# Patient Record
Sex: Female | Born: 1980 | Race: Asian | Hispanic: No | Marital: Married | Smoking: Never smoker
Health system: Southern US, Community
[De-identification: ages and names within clinical notes are randomized; demographics above are authoritative.]

## PROBLEM LIST (undated history)

## (undated) DIAGNOSIS — N719 Inflammatory disease of uterus, unspecified: Secondary | ICD-10-CM

## (undated) DIAGNOSIS — T8859XA Other complications of anesthesia, initial encounter: Secondary | ICD-10-CM

## (undated) DIAGNOSIS — J45909 Unspecified asthma, uncomplicated: Secondary | ICD-10-CM

## (undated) DIAGNOSIS — G5600 Carpal tunnel syndrome, unspecified upper limb: Secondary | ICD-10-CM

## (undated) DIAGNOSIS — O26899 Other specified pregnancy related conditions, unspecified trimester: Secondary | ICD-10-CM

## (undated) HISTORY — DX: Inflammatory disease of uterus, unspecified: N71.9

## (undated) HISTORY — DX: Unspecified asthma, uncomplicated: J45.909

## (undated) HISTORY — PX: OTHER SURGICAL HISTORY: SHX169

## (undated) HISTORY — DX: Other specified pregnancy related conditions, unspecified trimester: G56.00

## (undated) HISTORY — PX: BILATERAL SALPINGECTOMY: SHX5743

---

## 2014-02-26 HISTORY — PX: OTHER SURGICAL HISTORY: SHX169

## 2017-12-09 DIAGNOSIS — Z8759 Personal history of other complications of pregnancy, childbirth and the puerperium: Secondary | ICD-10-CM | POA: Insufficient documentation

## 2017-12-09 DIAGNOSIS — Z9079 Acquired absence of other genital organ(s): Secondary | ICD-10-CM | POA: Insufficient documentation

## 2018-01-04 DIAGNOSIS — Z8619 Personal history of other infectious and parasitic diseases: Secondary | ICD-10-CM | POA: Insufficient documentation

## 2018-03-22 DIAGNOSIS — Z91018 Allergy to other foods: Secondary | ICD-10-CM | POA: Insufficient documentation

## 2018-03-22 DIAGNOSIS — Z8709 Personal history of other diseases of the respiratory system: Secondary | ICD-10-CM | POA: Insufficient documentation

## 2018-03-22 DIAGNOSIS — Z9109 Other allergy status, other than to drugs and biological substances: Secondary | ICD-10-CM | POA: Insufficient documentation

## 2018-09-26 DIAGNOSIS — M545 Low back pain, unspecified: Secondary | ICD-10-CM | POA: Insufficient documentation

## 2018-09-26 DIAGNOSIS — R29898 Other symptoms and signs involving the musculoskeletal system: Secondary | ICD-10-CM | POA: Insufficient documentation

## 2019-02-07 DIAGNOSIS — N979 Female infertility, unspecified: Secondary | ICD-10-CM | POA: Insufficient documentation

## 2019-03-16 DIAGNOSIS — R87610 Atypical squamous cells of undetermined significance on cytologic smear of cervix (ASC-US): Secondary | ICD-10-CM | POA: Insufficient documentation

## 2019-05-23 DIAGNOSIS — N7011 Chronic salpingitis: Secondary | ICD-10-CM | POA: Insufficient documentation

## 2019-10-12 ENCOUNTER — Other Ambulatory Visit: Payer: Self-pay

## 2019-10-13 ENCOUNTER — Encounter: Payer: Self-pay | Admitting: Family Medicine

## 2019-10-13 ENCOUNTER — Ambulatory Visit: Payer: Managed Care, Other (non HMO) | Admitting: Family Medicine

## 2019-10-13 VITALS — BP 102/80 | HR 82 | Temp 96.9°F | Ht 63.75 in | Wt 163.2 lb

## 2019-10-13 DIAGNOSIS — G8929 Other chronic pain: Secondary | ICD-10-CM | POA: Diagnosis not present

## 2019-10-13 DIAGNOSIS — J4530 Mild persistent asthma, uncomplicated: Secondary | ICD-10-CM

## 2019-10-13 DIAGNOSIS — H66002 Acute suppurative otitis media without spontaneous rupture of ear drum, left ear: Secondary | ICD-10-CM

## 2019-10-13 DIAGNOSIS — M545 Low back pain: Secondary | ICD-10-CM | POA: Diagnosis not present

## 2019-10-13 MED ORDER — FLUTICASONE-SALMETEROL 100-50 MCG/DOSE IN AEPB: 1.0000 | INHALATION_SPRAY | Freq: Two times a day (BID) | RESPIRATORY_TRACT | 3 refills | Status: DC

## 2019-10-13 MED ORDER — AMOXICILLIN 500 MG PO CAPS: 500.0000 mg | ORAL_CAPSULE | Freq: Two times a day (BID) | ORAL | 0 refills | Status: AC

## 2019-10-13 NOTE — Patient Instructions (Signed)
Use flonase 2 sprays each nostril once daily x 1-2 wks Take claritin, zyrtec, or allegra 1 tab daily x 1-2 wks Take antibiotic as directed

## 2019-10-13 NOTE — Progress Notes (Addendum)
Shelby Ellis is a 39 y.o. female  Chief Complaint  Patient presents with  . Establish Care    Moved from [place]. Pt c/o ear pain starting in lt ear x 3 weeks and then going proceeding to rt ear..  Also c/o diffitculty with breathing x 1 week.    HPI: Shelby Ellis is a 39 y.o. female here to establish care with our office. She lives less than 10 min from our office. She complains of 3wks of Lt ear pain and then about 1 week ago Rt ear started to hurt. No fever, chills. Some drainage from Lt ear - "sticky" and "more than usual". She notes a "high-pitched" sound when she lays on side. She also notes sore throat x 1-2 wks.   She has h/o asthma. She uses advair PRN. She notes cold weather this past week has triggered worsening breathing. No wheezing. Slight increase in cough. She is hesitant to use adviar on a regular or consistent basis but states it does help when she uses.  She in the past had ventolin PRN and singulair.   She has Rt low back and hip pain. She has found relief w/ heat and massage. She goes 2/xmo. She is requesting a letter to submit to insurance to see if they will cover.    History reviewed. No pertinent past medical history.  Past Surgical History:  Procedure Laterality Date  . ectopic pregnacy  02/2014    Social History   Socioeconomic History  . Marital status: Married    Spouse name: Not on file  . Number of children: Not on file  . Years of education: Not on file  . Highest education level: Not on file  Occupational History  . Not on file  Tobacco Use  . Smoking status: Never Smoker  . Smokeless tobacco: Never Used  Substance and Sexual Activity  . Alcohol use: Not on file  . Drug use: Not on file  . Sexual activity: Not on file  Other Topics Concern  . Not on file  Social History Narrative  . Not on file   Social Determinants of Health   Financial Resource Strain:   . Difficulty of Paying Living Expenses: Not on file  Food  Insecurity:   . Worried About Charity fundraiser in the Last Year: Not on file  . Ran Out of Food in the Last Year: Not on file  Transportation Needs:   . Lack of Transportation (Medical): Not on file  . Lack of Transportation (Non-Medical): Not on file  Physical Activity:   . Days of Exercise per Week: Not on file  . Minutes of Exercise per Session: Not on file  Stress:   . Feeling of Stress : Not on file  Social Connections:   . Frequency of Communication with Friends and Family: Not on file  . Frequency of Social Gatherings with Friends and Family: Not on file  . Attends Religious Services: Not on file  . Active Member of Clubs or Organizations: Not on file  . Attends Archivist Meetings: Not on file  . Marital Status: Not on file  Intimate Partner Violence:   . Fear of Current or Ex-Partner: Not on file  . Emotionally Abused: Not on file  . Physically Abused: Not on file  . Sexually Abused: Not on file    Family History  Problem Relation Age of Onset  . Asthma Mother   . Hypertension Father   .  Heart block Father       There is no immunization history on file for this patient.  Outpatient Encounter Medications as of 10/13/2019  Medication Sig  . butalbital-acetaminophen-caffeine (FIORICET) 50-325-40 MG tablet 2 po initially and can repeat one in 2-4 hours and may repeat in 6h until headache breaks, not to exceed 4/day  . EPINEPHrine 0.3 mg/0.3 mL IJ SOAJ injection Inject into the muscle.  . Fluticasone-Salmeterol (ADVAIR) 100-50 MCG/DOSE AEPB Inhale into the lungs.  . montelukast (SINGULAIR) 10 MG tablet 10 mg.  . Multiple Vitamin (MULTIVITAMIN) capsule Take by mouth.   No facility-administered encounter medications on file as of 10/13/2019.     ROS: Pertinent positives and negatives noted in HPI. Remainder of ROS non-contributory  Allergies  Allergen Reactions  . Shellfish Allergy Other (See Comments)    BP 102/80 (BP Location: Left Arm, Patient  Position: Sitting, Cuff Size: Normal)   Pulse 82   Temp (!) 96.9 F (36.1 C) (Temporal)   Ht 5' 3.75" (1.619 m)   Wt 163 lb 3.2 oz (74 kg)   LMP 09/19/2019   SpO2 (!) 65%   BMI 28.23 kg/m   Physical Exam  Constitutional: She is oriented to person, place, and time. She appears well-developed and well-nourished. No distress.  HENT:  Head: Normocephalic and atraumatic.  Right Ear: External ear and ear canal normal. A middle ear effusion is present.  Left Ear: External ear and ear canal normal. Tympanic membrane is erythematous and bulging. A middle ear effusion is present.  Nose: Nose normal.  Mouth/Throat: Posterior oropharyngeal erythema present. No oropharyngeal exudate or posterior oropharyngeal edema.  Eyes: Pupils are equal, round, and reactive to light. Conjunctivae and EOM are normal. Right eye exhibits no discharge. Left eye exhibits no discharge.  Cardiovascular: Normal rate, regular rhythm and normal heart sounds.  Pulmonary/Chest: Effort normal and breath sounds normal. No stridor. No respiratory distress. She has no wheezes.  Musculoskeletal:     Cervical back: Neck supple.  Lymphadenopathy:    She has no cervical adenopathy.  Neurological: She is alert and oriented to person, place, and time.     A/P:  1. Mild persistent asthma without complication - pt has been hesitant to use on daily/consistent basis, doesn't want to "become addicted". Stressed importance of adequate control of her asthma and assured her there is no addiction or dependency potential with advair. She agrees to use daily x 1-2 wks when needed (typically winter and when she has URI) Refill: - Fluticasone-Salmeterol (ADVAIR) 100-50 MCG/DOSE AEPB; Inhale 1 puff into the lungs 2 (two) times daily.  Dispense: 60 each; Refill: 3  2. Chronic right-sided low back pain without sciatica - chronic, stable - note given for pt to submit to insurance to try to get part/all of massage therapy covered  3.  Non-recurrent acute suppurative otitis media of left ear without spontaneous rupture of tympanic membrane Rx: - amoxicillin (AMOXIL) 500 MG capsule; Take 1 capsule (500 mg total) by mouth 2 (two) times daily for 10 days.  Dispense: 20 capsule; Refill: 0 - flonase daily - claritin, zyrtec, or allegra 1 tab daily - f/u in 2 wks if minimal or no improvement, sooner if symptoms worsen Discussed plan and reviewed medications with patient, including risks, benefits, and potential side effects. Pt expressed understand. All questions answered.  This visit occurred during the SARS-CoV-2 public health emergency.  Safety protocols were in place, including screening questions prior to the visit, additional usage of staff PPE, and extensive  cleaning of exam room while observing appropriate contact time as indicated for disinfecting solutions.

## 2020-02-15 ENCOUNTER — Other Ambulatory Visit: Payer: Self-pay

## 2020-02-16 ENCOUNTER — Encounter: Payer: Self-pay | Admitting: Family Medicine

## 2020-02-16 ENCOUNTER — Ambulatory Visit: Payer: Managed Care, Other (non HMO) | Admitting: Family Medicine

## 2020-02-16 ENCOUNTER — Telehealth: Payer: Self-pay

## 2020-02-16 VITALS — BP 118/64 | HR 91 | Temp 97.2°F | Ht 63.75 in | Wt 166.6 lb

## 2020-02-16 DIAGNOSIS — F411 Generalized anxiety disorder: Secondary | ICD-10-CM | POA: Diagnosis not present

## 2020-02-16 DIAGNOSIS — G43909 Migraine, unspecified, not intractable, without status migrainosus: Secondary | ICD-10-CM

## 2020-02-16 DIAGNOSIS — R55 Syncope and collapse: Secondary | ICD-10-CM | POA: Diagnosis not present

## 2020-02-16 DIAGNOSIS — R42 Dizziness and giddiness: Secondary | ICD-10-CM | POA: Diagnosis not present

## 2020-02-16 MED ORDER — ESCITALOPRAM OXALATE 5 MG PO TABS: ORAL_TABLET | ORAL | 2 refills | Status: DC

## 2020-02-16 MED ORDER — ESCITALOPRAM OXALATE 10 MG PO TABS: 10.0000 mg | ORAL_TABLET | Freq: Every day | ORAL | 3 refills | Status: DC

## 2020-02-16 MED ORDER — BUTALBITAL-APAP-CAFFEINE 50-325-40 MG PO TABS: 1.0000 | ORAL_TABLET | Freq: Four times a day (QID) | ORAL | 0 refills | Status: DC | PRN

## 2020-02-16 NOTE — Telephone Encounter (Signed)
Dr. Salena Saner please advise.  [facility] called and said the insurance wouldn't pay for the lexapro for 2 a day but 1 only. Pharmacist said that would have to be a separate prescriptions. They did fill for a week for her till it could be fixed.

## 2020-02-16 NOTE — Telephone Encounter (Signed)
She can take the 5mg  tabs that were filled today for 1 week. I sent Rx for lexapro 10mg  1 tab daily that she can start after 1 week of 5mg  daily

## 2020-02-16 NOTE — Patient Instructions (Addendum)
[facility]: [email or web address]  [facility] [email or web address]  [email or web address]  [facility] [email or web address]  [email or web address]  

## 2020-02-16 NOTE — Progress Notes (Signed)
Shelby Ellis is a 39 y.o. female  Chief Complaint  Patient presents with  . Loss of Consciousness    Patient is here today reporting a Syncopal episode.  She is C/O a H/A that is severe in nature//ttok a bath got out 3 days ago and went out for a few min and came to-headache so bad she can't see or speak    HPI: Shelby Ellis is a 39 y.o. female accompanied by her husband. Pt states 3 days ago she had a syncopal episode after getting out the bath tub. Pt felt lightheaded when she stood up so sat down on side of the tub. Husband states she lost consciousness, fell forward but he caught her from hitting the ground, and she was out for "a few seconds" then "came back". She asked for sweet tea which husband got her. She rested and then went to bed.  She did not have a headache at this time.  Pt states she had similar episodes in 2015. Had CT head that was normal. Headaches and symptoms at that time thought to be due to stress. She also passed at [facility] in 2019.  She has Rt sided headaches, when she had headaches she find it hard words and speak clearly. Pt has at least 1 headache per week. + nausea, lightheaded, blurry vision, photophobia and phonophobia. She feels headaches are more intense than they used to be and this has been so since 07/2019.  Pt does have a h/o migraine headaches.     She states she does not sleep well d/t mind always going. She endorses significant anxiety and speaks to a past h/o such as far back as her teens.  She sees a Veterinary surgeon once per month.     History reviewed. No pertinent past medical history.  Past Surgical History:  Procedure Laterality Date  . ectopic pregnacy  02/2014    Social History   Socioeconomic History  . Marital status: Married    Spouse name: Not on file  . Number of children: Not on file  . Years of education: Not on file  . Highest education level: Not on file  Occupational History  . Not on file  Tobacco Use  . Smoking status: Never  Smoker  . Smokeless tobacco: Never Used  Substance and Sexual Activity  . Alcohol use: Not on file  . Drug use: Not on file  . Sexual activity: Not on file  Other Topics Concern  . Not on file  Social History Narrative  . Not on file   Social Determinants of Health   Financial Resource Strain:   . Difficulty of Paying Living Expenses:   Food Insecurity:   . Worried About Programme researcher, broadcasting/film/video in the Last Year:   . Barista in the Last Year:   Transportation Needs:   . Freight forwarder (Medical):   Marland Kitchen Lack of Transportation (Non-Medical):   Physical Activity:   . Days of Exercise per Week:   . Minutes of Exercise per Session:   Stress:   . Feeling of Stress :   Social Connections:   . Frequency of Communication with Friends and Family:   . Frequency of Social Gatherings with Friends and Family:   . Attends Religious Services:   . Active Member of Clubs or Organizations:   . Attends Banker Meetings:   Marland Kitchen Marital Status:   Intimate Partner Violence:   . Fear of Current or Ex-Partner:   .  Emotionally Abused:   Marland Kitchen Physically Abused:   . Sexually Abused:     Family History  Problem Relation Age of Onset  . Asthma Mother   . Hypertension Father   . Heart block Father       There is no immunization history on file for this patient.  Outpatient Encounter Medications as of 02/16/2020  Medication Sig  . butalbital-acetaminophen-caffeine (FIORICET) 50-325-40 MG tablet 2 po initially and can repeat one in 2-4 hours and may repeat in 6h until headache breaks, not to exceed 4/day  . EPINEPHrine 0.3 mg/0.3 mL IJ SOAJ injection Inject into the muscle.  . Fluticasone-Salmeterol (ADVAIR) 100-50 MCG/DOSE AEPB Inhale 1 puff into the lungs 2 (two) times daily.  . Multiple Vitamin (MULTIVITAMIN) capsule Take by mouth.   No facility-administered encounter medications on file as of 02/16/2020.     ROS: Pertinent positives and negatives noted in HPI. Remainder  of ROS non-contributory   Allergies  Allergen Reactions  . Shellfish Allergy Other (See Comments)    There were no vitals taken for this visit.   BP Readings from Last 3 Encounters:  10/13/19 102/80   Pulse Readings from Last 3 Encounters:  10/13/19 82    Physical Exam  Constitutional: She is oriented to person, place, and time. She appears well-developed and well-nourished. No distress.  HENT:  Head: Normocephalic and atraumatic.  Right Ear: Tympanic membrane, external ear and ear canal normal.  Left Ear: Tympanic membrane, external ear and ear canal normal.  Eyes: Pupils are equal, round, and reactive to light. Conjunctivae and EOM are normal.  Cardiovascular: Normal rate, regular rhythm, normal heart sounds and intact distal pulses.  No murmur heard. Pulmonary/Chest: Effort normal and breath sounds normal.  Musculoskeletal:     Cervical back: Normal range of motion and neck supple.  Lymphadenopathy:    She has no cervical adenopathy.  Neurological: She is alert and oriented to person, place, and time. She exhibits normal muscle tone. Coordination normal.  Psychiatric: She has a normal mood and affect. Her behavior is normal.     A/P:  1. Migraine without status migrainosus, not intractable, unspecified migraine type - chronic, but increased in frequency and more so intensity. Stress/anxiety likely trigger. - CT Head Wo Contrast; Future Refill: - butalbital-acetaminophen-caffeine (FIORICET) 50-325-40 MG tablet; Take 1 tablet by mouth every 6 (six) hours as needed for headache or migraine.  Dispense: 30 tablet; Refill: 0  2. GAD (generalized anxiety disorder) - sounds to be a chronic issue going back to teens - seeing counselor once per month but I recommended she be seen more frequently (once per week or once every 2 wks). Pt is not fully satisfied/comfortable with current counselor so I encouraged her to look for another person with whom she feels more comfortable.  Provided list of a few groups/providers for pt and husband to review Rx: - lexapro 5mg  1 tab daily x 1 week then increase to 10mg  daily - f/u in 3-4 wks or sooner PRN - pt would also benefit from regular exercise   3. Episodic lightheadedness 4. Syncope, unspecified syncope type - episodes in the past - 2015, 2019, 3 days ago - evaluated in the past with CT head and ? Additional imaging - normal as per pt - I very much think symptoms are stress related, as they have happened in the past during high stress periods in pts life. Will obtain CT head d/t change in migraine frequency and intensity (see above #1) but will  defer further work-up (echo, carotid duplex, etc) at this time and see how pt is feeling at f/u appt in 3 wks (see above #2)   I spent 45 min with the patient today obtaining a detailed history, discussing diagnoses, treatment plans.   This visit occurred during the SARS-CoV-2 public health emergency.  Safety protocols were in place, including screening questions prior to the visit, additional usage of staff PPE, and extensive cleaning of exam room while observing appropriate contact time as indicated for disinfecting solutions.

## 2020-02-16 NOTE — Addendum Note (Signed)
Addended by: Overton Mam on: 02/16/2020 04:33 PM   Modules accepted: Orders

## 2020-02-28 ENCOUNTER — Ambulatory Visit (HOSPITAL_BASED_OUTPATIENT_CLINIC_OR_DEPARTMENT_OTHER)
Admission: RE | Admit: 2020-02-28 | Discharge: 2020-02-28 | Disposition: A | Discharge status: Home / Self Care | Payer: Managed Care, Other (non HMO) | Source: Ambulatory Visit | Attending: Obstetrics and Gynecology | Admitting: Obstetrics and Gynecology

## 2020-02-28 ENCOUNTER — Other Ambulatory Visit (HOSPITAL_BASED_OUTPATIENT_CLINIC_OR_DEPARTMENT_OTHER): Payer: Self-pay | Admitting: Obstetrics and Gynecology

## 2020-02-28 ENCOUNTER — Ambulatory Visit (HOSPITAL_BASED_OUTPATIENT_CLINIC_OR_DEPARTMENT_OTHER)
Admission: RE | Admit: 2020-02-28 | Discharge: 2020-02-28 | Disposition: A | Discharge status: Home / Self Care | Payer: Managed Care, Other (non HMO) | Source: Ambulatory Visit | Attending: Family Medicine | Admitting: Family Medicine

## 2020-02-28 ENCOUNTER — Other Ambulatory Visit: Payer: Self-pay

## 2020-02-28 DIAGNOSIS — Z1231 Encounter for screening mammogram for malignant neoplasm of breast: Secondary | ICD-10-CM

## 2020-02-28 DIAGNOSIS — G43909 Migraine, unspecified, not intractable, without status migrainosus: Secondary | ICD-10-CM

## 2020-03-13 ENCOUNTER — Ambulatory Visit: Payer: Managed Care, Other (non HMO) | Admitting: Family Medicine

## 2020-04-16 ENCOUNTER — Telehealth: Payer: Self-pay | Admitting: Family Medicine

## 2020-04-16 NOTE — Telephone Encounter (Signed)
Called patient regarding canceled appointment on 03/13/2020. Left message to give the office a call back to reschedule.

## 2020-10-09 ENCOUNTER — Other Ambulatory Visit: Payer: Self-pay

## 2020-10-09 ENCOUNTER — Ambulatory Visit (INDEPENDENT_AMBULATORY_CARE_PROVIDER_SITE_OTHER): Payer: Managed Care, Other (non HMO) | Admitting: Family Medicine

## 2020-10-09 ENCOUNTER — Encounter: Payer: Self-pay | Admitting: Family Medicine

## 2020-10-09 VITALS — BP 118/80 | HR 94 | Temp 97.4°F | Ht 64.0 in | Wt 173.2 lb

## 2020-10-09 DIAGNOSIS — L910 Hypertrophic scar: Secondary | ICD-10-CM

## 2020-10-09 DIAGNOSIS — F411 Generalized anxiety disorder: Secondary | ICD-10-CM | POA: Diagnosis not present

## 2020-10-09 DIAGNOSIS — Z2821 Immunization not carried out because of patient refusal: Secondary | ICD-10-CM

## 2020-10-09 DIAGNOSIS — J4530 Mild persistent asthma, uncomplicated: Secondary | ICD-10-CM | POA: Diagnosis not present

## 2020-10-09 DIAGNOSIS — Z0000 Encounter for general adult medical examination without abnormal findings: Secondary | ICD-10-CM

## 2020-10-09 DIAGNOSIS — Z13220 Encounter for screening for lipoid disorders: Secondary | ICD-10-CM | POA: Diagnosis not present

## 2020-10-09 DIAGNOSIS — Z1321 Encounter for screening for nutritional disorder: Secondary | ICD-10-CM | POA: Diagnosis not present

## 2020-10-09 DIAGNOSIS — G43909 Migraine, unspecified, not intractable, without status migrainosus: Secondary | ICD-10-CM

## 2020-10-09 MED ORDER — EPINEPHRINE 0.3 MG/0.3ML IJ SOAJ: 0.3000 mg | INTRAMUSCULAR | 2 refills | Status: AC | PRN

## 2020-10-09 MED ORDER — FLUTICASONE-SALMETEROL 100-50 MCG/DOSE IN AEPB: 1.0000 | INHALATION_SPRAY | Freq: Two times a day (BID) | RESPIRATORY_TRACT | 3 refills | Status: DC

## 2020-10-09 MED ORDER — BUTALBITAL-APAP-CAFFEINE 50-325-40 MG PO TABS: 1.0000 | ORAL_TABLET | Freq: Four times a day (QID) | ORAL | 0 refills | Status: DC | PRN

## 2020-10-09 NOTE — Addendum Note (Signed)
Addended by: Overton Mam on: 10/09/2020 09:38 AM   Modules accepted: Orders

## 2020-10-09 NOTE — Patient Instructions (Signed)
[facility] at [facility] [address] [place], [place] [address] [phone]   Health Maintenance, Female Adopting a healthy lifestyle and getting preventive care are important in promoting health and wellness. Ask your health care provider about:  The right schedule for you to have regular tests and exams.  Things you can do on your own to prevent diseases and keep yourself healthy. What should I know about diet, weight, and exercise? Eat a healthy diet  Eat a diet that includes plenty of vegetables, fruits, low-fat dairy products, and lean protein.  Do not eat a lot of foods that are high in solid fats, added sugars, or sodium.   Maintain a healthy weight Body mass index (BMI) is used to identify weight problems. It estimates body fat based on height and weight. Your health care provider can help determine your BMI and help you achieve or maintain a healthy weight. Get regular exercise Get regular exercise. This is one of the most important things you can do for your health. Most adults should:  Exercise for at least 150 minutes each week. The exercise should increase your heart rate and make you sweat (moderate-intensity exercise).  Do strengthening exercises at least twice a week. This is in addition to the moderate-intensity exercise.  Spend less time sitting. Even light physical activity can be beneficial. Watch cholesterol and blood lipids Have your blood tested for lipids and cholesterol at 41 years of age, then have this test every 5 years. Have your cholesterol levels checked more often if:  Your lipid or cholesterol levels are high.  You are older than 41 years of age.  You are at high risk for heart disease. What should I know about cancer screening? Depending on your health history and family history, you may need to have cancer screening at various ages. This may include screening for:  Breast cancer.  Cervical  cancer.  Colorectal cancer.  Skin cancer.  Lung cancer. What should I know about heart disease, diabetes, and high blood pressure? Blood pressure and heart disease  High blood pressure causes heart disease and increases the risk of stroke. This is more likely to develop in people who have high blood pressure readings, are of African descent, or are overweight.  Have your blood pressure checked: ? Every 3-5 years if you are 70-81 years of age. ? Every year if you are 15 years old or older. Diabetes Have regular diabetes screenings. This checks your fasting blood sugar level. Have the screening done:  Once every three years after age 35 if you are at a normal weight and have a low risk for diabetes.  More often and at a younger age if you are overweight or have a high risk for diabetes. What should I know about preventing infection? Hepatitis B If you have a higher risk for hepatitis B, you should be screened for this virus. Talk with your health care provider to find out if you are at risk for hepatitis B infection. Hepatitis C Testing is recommended for:  Everyone born from [birth year] through 1966.  Anyone with known risk factors for hepatitis C. Sexually transmitted infections (STIs)  Get screened for STIs, including gonorrhea and chlamydia, if: ? You are sexually active and are younger than 41 years of age. ? You are older than 41 years of age and your health care provider tells you that you are at risk for this type of infection. ? Your sexual activity has  changed since you were last screened, and you are at increased risk for chlamydia or gonorrhea. Ask your health care provider if you are at risk.  Ask your health care provider about whether you are at high risk for HIV. Your health care provider may recommend a prescription medicine to help prevent HIV infection. If you choose to take medicine to prevent HIV, you should first get tested for HIV. You should then be tested every 3  months for as long as you are taking the medicine. Pregnancy  If you are about to stop having your period (premenopausal) and you may become pregnant, seek counseling before you get pregnant.  Take 400 to 800 micrograms (mcg) of folic acid every day if you become pregnant.  Ask for birth control (contraception) if you want to prevent pregnancy. Osteoporosis and menopause Osteoporosis is a disease in which the bones lose minerals and strength with aging. This can result in bone fractures. If you are 64 years old or older, or if you are at risk for osteoporosis and fractures, ask your health care provider if you should:  Be screened for bone loss.  Take a calcium or vitamin D supplement to lower your risk of fractures.  Be given hormone replacement therapy (HRT) to treat symptoms of menopause. Follow these instructions at home: Lifestyle  Do not use any products that contain nicotine or tobacco, such as cigarettes, e-cigarettes, and chewing tobacco. If you need help quitting, ask your health care provider.  Do not use street drugs.  Do not share needles.  Ask your health care provider for help if you need support or information about quitting drugs. Alcohol use  Do not drink alcohol if: ? Your health care provider tells you not to drink. ? You are pregnant, may be pregnant, or are planning to become pregnant.  If you drink alcohol: ? Limit how much you use to 0-1 drink a day. ? Limit intake if you are breastfeeding.  Be aware of how much alcohol is in your drink. In the [place] one drink equals one 12 oz bottle of beer (355 mL), one 5 oz glass of wine (148 mL), or one 1 oz glass of hard liquor (44 mL). General instructions  Schedule regular health, dental, and eye exams.  Stay current with your vaccines.  Tell your health care provider if: ? You often feel depressed. ? You have ever been abused or do not feel safe at home. Summary  Adopting a healthy lifestyle and getting  preventive care are important in promoting health and wellness.  Follow your health care provider's instructions about healthy diet, exercising, and getting tested or screened for diseases.  Follow your health care provider's instructions on monitoring your cholesterol and blood pressure. This information is not intended to replace advice given to you by your health care provider. Make sure you discuss any questions you have with your health care provider. Document Revised: 09/07/2018 Document Reviewed: 09/07/2018 Elsevier Patient Education  2021 [employer].

## 2020-10-09 NOTE — Progress Notes (Addendum)
Shelby Ellis is a 40 y.o. female  Chief Complaint  Patient presents with  . Annual Exam    CPE/labs. not fasting today.  C/o having a bump on the RT side of nose.    Declines flu shot today.    HPI: Shelby Ellis is a 40 y.o. female here for annual CPE, labs. She is not fasting today.  She declines flu vaccine.   Last PAP: UTD and follows with GYN at [facility] - would like to switch to [facility]; she follows also with [facility] for IVF Last mammo: UTD 02/2020  Diet/Exercise: average diet, no regular exercise but she does have treadmill and small home gym; she does 10 min of stretching most days Dental: UTD Vision: she has glasses, UTD on exam   No past medical history on file.  Past Surgical History:  Procedure Laterality Date  . ectopic pregnacy  02/2014    Social History   Socioeconomic History  . Marital status: Married    Spouse name: Not on file  . Number of children: Not on file  . Years of education: Not on file  . Highest education level: Not on file  Occupational History  . Not on file  Tobacco Use  . Smoking status: Never Smoker  . Smokeless tobacco: Never Used  Vaping Use  . Vaping Use: Never used  Substance and Sexual Activity  . Alcohol use: Yes    Comment: socially  . Drug use: Never  . Sexual activity: Yes  Other Topics Concern  . Not on file  Social History Narrative  . Not on file   Social Determinants of Health   Financial Resource Strain: Not on file  Food Insecurity: Not on file  Transportation Needs: Not on file  Physical Activity: Not on file  Stress: Not on file  Social Connections: Not on file  Intimate Partner Violence: Not on file    Family History  Problem Relation Age of Onset  . Asthma Mother   . Hypertension Father   . Heart block Father      Immunization History  Administered Date(s) Administered  . PFIZER SARS-COV-2 Vaccination 12/14/2019, 01/04/2020    Outpatient Encounter Medications as of 10/09/2020  Medication  Sig  . butalbital-acetaminophen-caffeine (FIORICET) 50-325-40 MG tablet Take 1 tablet by mouth every 6 (six) hours as needed for headache or migraine.  Marland Kitchen EPINEPHrine 0.3 mg/0.3 mL IJ SOAJ injection Inject into the muscle.  . Fluticasone-Salmeterol (ADVAIR) 100-50 MCG/DOSE AEPB Inhale 1 puff into the lungs 2 (two) times daily.  . Multiple Vitamin (MULTIVITAMIN) capsule Take by mouth.  . escitalopram (LEXAPRO) 10 MG tablet Take 1 tablet (10 mg total) by mouth daily.   No facility-administered encounter medications on file as of 10/09/2020.     ROS: Gen: no fever, chills  Skin: no rash, itching ENT: no ear pain, ear drainage, nasal congestion, rhinorrhea, sinus pressure, sore throat Eyes: no blurry vision, double vision Resp: no cough, wheeze,SOB CV: no CP, palpitations, LE edema,  GI: no heartburn, n/v/d/c, abd pain GU: no dysuria, urgency, frequency, hematuria MSK: no joint pain, myalgias, back pain Neuro: no dizziness, weakness; migraine headaches - 1-2 per month and relieved with 1 tab of fioricet Psych: no depression,+ anxiety, no insomnia   Allergies  Allergen Reactions  . Shellfish Allergy Other (See Comments)    BP 118/80   Pulse 94   Temp (!) 97.4 F (36.3 C) (Temporal)   Ht 5\' 4"  (1.626 m)   Wt 173 lb  3.2 oz (78.6 kg)   SpO2 98%   BMI 29.73 kg/m    BP Readings from Last 3 Encounters:  10/09/20 118/80  02/16/20 118/64  10/13/19 102/80   Pulse Readings from Last 3 Encounters:  10/09/20 94  02/16/20 91  10/13/19 82   Wt Readings from Last 3 Encounters:  10/09/20 173 lb 3.2 oz (78.6 kg)  02/16/20 166 lb 9.6 oz (75.6 kg)  10/13/19 163 lb 3.2 oz (74 kg)    Physical Exam Constitutional:      General: She is not in acute distress.    Appearance: She is well-developed and well-nourished.  HENT:     Head: Normocephalic and atraumatic.     Right Ear: Tympanic membrane and ear canal normal.     Left Ear: Tympanic membrane and ear canal normal.     Nose: Nose  normal.     Mouth/Throat:     Mouth: Oropharynx is clear and moist and mucous membranes are normal.  Eyes:     Conjunctiva/sclera: Conjunctivae normal.     Pupils: Pupils are equal, round, and reactive to light.  Neck:     Thyroid: No thyromegaly.  Cardiovascular:     Rate and Rhythm: Normal rate and regular rhythm.     Pulses: Intact distal pulses.     Heart sounds: Normal heart sounds. No murmur heard.   Pulmonary:     Effort: Pulmonary effort is normal. No respiratory distress.     Breath sounds: Normal breath sounds. No wheezing or rhonchi.  Abdominal:     General: Bowel sounds are normal. There is no distension.     Palpations: Abdomen is soft. There is no mass.     Tenderness: There is no abdominal tenderness.  Musculoskeletal:        General: No edema.     Cervical back: Neck supple.     Right lower leg: No edema.     Left lower leg: No edema.  Lymphadenopathy:     Cervical: No cervical adenopathy.  Skin:    General: Skin is warm and dry.  Neurological:     Mental Status: She is alert and oriented to person, place, and time.     Motor: No abnormal muscle tone.     Coordination: Coordination normal.  Psychiatric:        Mood and Affect: Mood and affect normal.        Behavior: Behavior normal.     A/P:  1. Annual physical exam - discussed importance of regular CV exercise, healthy diet, adequate sleep - UTD on mammo and PAP - UTD on vision and dental appts - declines flu vaccine and has not yet had booster and is unsure about getting it - strongly recommended pt get booster - ALT - AST - Basic metabolic panel - CBC - Lipid panel - next CPE in 1 year  2. Mild persistent asthma without complication - stable, well-controlled Refill: - Fluticasone-Salmeterol (ADVAIR) 100-50 MCG/DOSE AEPB; Inhale 1 puff into the lungs 2 (two) times daily.  Dispense: 60 each; Refill: 3  3. GAD (generalized anxiety disorder) - pt took 2 tab of lexapro and then stopped, does  not want to restart at this time  4. Screening for lipid disorders - Lipid panel  5. Encounter for vitamin deficiency screening - VITAMIN D 25 Hydroxy (Vit-D Deficiency, Fractures)  6. Influenza vaccination declined by patient  7. Migraine without status migrainosus, not intractable, unspecified migraine type - pt has 1-2 per mo and  relieved with 1 tab of Fioricet  Refill: - butalbital-acetaminophen-caffeine (FIORICET) 50-325-40 MG tablet; Take 1 tablet by mouth every 6 (six) hours as needed for headache or migraine.  Dispense: 60 tablet; Refill: 0  8. Keloid scar of skin - Ambulatory referral to Plastic Surgery   This visit occurred during the SARS-CoV-2 public health emergency.  Safety protocols were in place, including screening questions prior to the visit, additional usage of staff PPE, and extensive cleaning of exam room while observing appropriate contact time as indicated for disinfecting solutions.

## 2020-10-15 ENCOUNTER — Other Ambulatory Visit: Payer: Managed Care, Other (non HMO)

## 2020-10-17 ENCOUNTER — Other Ambulatory Visit (INDEPENDENT_AMBULATORY_CARE_PROVIDER_SITE_OTHER): Payer: Managed Care, Other (non HMO)

## 2020-10-17 ENCOUNTER — Other Ambulatory Visit: Payer: Self-pay

## 2020-10-17 DIAGNOSIS — Z0000 Encounter for general adult medical examination without abnormal findings: Secondary | ICD-10-CM

## 2020-10-17 LAB — CBC
HCT: 39.6 % (ref 36.0–46.0)
Hemoglobin: 13.1 g/dL (ref 12.0–15.0)
MCHC: 33 g/dL (ref 30.0–36.0)
MCV: 93.9 fl (ref 78.0–100.0)
Platelets: 346 10*3/uL (ref 150.0–400.0)
RBC: 4.22 Mil/uL (ref 3.87–5.11)
RDW: 13.4 % (ref 11.5–15.5)
WBC: 9.5 10*3/uL (ref 4.0–10.5)

## 2020-10-17 LAB — BASIC METABOLIC PANEL
BUN: 21 mg/dL (ref 6–23)
CO2: 25 mEq/L (ref 19–32)
Calcium: 9.6 mg/dL (ref 8.4–10.5)
Chloride: 107 mEq/L (ref 96–112)
Creatinine, Ser: 1.06 mg/dL (ref 0.40–1.20)
GFR: 65.57 mL/min (ref >60.00)
Glucose, Bld: 100 mg/dL — ABNORMAL HIGH (ref 70–99)
Potassium: 5 mEq/L (ref 3.5–5.1)
Sodium: 139 mEq/L (ref 135–145)

## 2020-10-17 LAB — LIPID PANEL
Cholesterol: 225 mg/dL — ABNORMAL HIGH (ref 0–200)
HDL: 44.3 mg/dL (ref >39.00)
LDL Cholesterol: 146 mg/dL — ABNORMAL HIGH (ref 0–99)
NonHDL: 180.58
Total CHOL/HDL Ratio: 5
Triglycerides: 175 mg/dL — ABNORMAL HIGH (ref 0.0–149.0)
VLDL: 35 mg/dL (ref 0.0–40.0)

## 2020-10-17 LAB — ALT: ALT: 15 U/L (ref 0–35)

## 2020-10-17 LAB — AST: AST: 17 U/L (ref 0–37)

## 2020-10-17 LAB — VITAMIN D 25 HYDROXY (VIT D DEFICIENCY, FRACTURES): VITD: 24.26 ng/mL — ABNORMAL LOW (ref 30.00–100.00)

## 2020-11-03 ENCOUNTER — Emergency Department (HOSPITAL_COMMUNITY): Payer: Managed Care, Other (non HMO)

## 2020-11-03 ENCOUNTER — Emergency Department (HOSPITAL_COMMUNITY)
Admission: EM | Admit: 2020-11-03 | Discharge: 2020-11-03 | Disposition: A | Discharge status: Home / Self Care | Payer: Managed Care, Other (non HMO) | Attending: Emergency Medicine | Admitting: Emergency Medicine

## 2020-11-03 ENCOUNTER — Other Ambulatory Visit: Payer: Self-pay

## 2020-11-03 ENCOUNTER — Emergency Department (HOSPITAL_COMMUNITY)
Admission: EM | Admit: 2020-11-03 | Discharge: 2020-11-03 | Disposition: A | Discharge status: Home / Self Care | Payer: Managed Care, Other (non HMO) | Source: Home / Self Care | Attending: Emergency Medicine | Admitting: Emergency Medicine

## 2020-11-03 ENCOUNTER — Encounter (HOSPITAL_COMMUNITY): Payer: Self-pay | Admitting: Obstetrics and Gynecology

## 2020-11-03 ENCOUNTER — Encounter (HOSPITAL_COMMUNITY): Payer: Self-pay

## 2020-11-03 DIAGNOSIS — Z5321 Procedure and treatment not carried out due to patient leaving prior to being seen by health care provider: Secondary | ICD-10-CM | POA: Diagnosis not present

## 2020-11-03 DIAGNOSIS — R102 Pelvic and perineal pain: Secondary | ICD-10-CM | POA: Insufficient documentation

## 2020-11-03 LAB — PREGNANCY, URINE: Preg Test, Ur: NEGATIVE

## 2020-11-03 LAB — COMPREHENSIVE METABOLIC PANEL
ALT: 13 U/L (ref 0–44)
AST: 16 U/L (ref 15–41)
Albumin: 4 g/dL (ref 3.5–5.0)
Alkaline Phosphatase: 44 U/L (ref 38–126)
Anion gap: 10 (ref 5–15)
BUN: 21 mg/dL — ABNORMAL HIGH (ref 6–20)
CO2: 24 mmol/L (ref 22–32)
Calcium: 9.4 mg/dL (ref 8.9–10.3)
Chloride: 104 mmol/L (ref 98–111)
Creatinine, Ser: 1.23 mg/dL — ABNORMAL HIGH (ref 0.44–1.00)
GFR, Estimated: 57 mL/min — ABNORMAL LOW (ref >60)
Glucose, Bld: 95 mg/dL (ref 70–99)
Potassium: 4.9 mmol/L (ref 3.5–5.1)
Sodium: 138 mmol/L (ref 135–145)
Total Bilirubin: 0.4 mg/dL (ref 0.3–1.2)
Total Protein: 7.7 g/dL (ref 6.5–8.1)

## 2020-11-03 LAB — CBC
HCT: 39.2 % (ref 36.0–46.0)
Hemoglobin: 13 g/dL (ref 12.0–15.0)
MCH: 31.3 pg (ref 26.0–34.0)
MCHC: 33.2 g/dL (ref 30.0–36.0)
MCV: 94.2 fL (ref 80.0–100.0)
Platelets: 343 10*3/uL (ref 150–400)
RBC: 4.16 MIL/uL (ref 3.87–5.11)
RDW: 12.2 % (ref 11.5–15.5)
WBC: 9.4 10*3/uL (ref 4.0–10.5)
nRBC: 0 % (ref 0.0–0.2)

## 2020-11-03 LAB — URINALYSIS, ROUTINE W REFLEX MICROSCOPIC
Bacteria, UA: NONE SEEN
Bilirubin Urine: NEGATIVE
Glucose, UA: NEGATIVE mg/dL
Ketones, ur: NEGATIVE mg/dL
Leukocytes,Ua: NEGATIVE
Nitrite: NEGATIVE
Protein, ur: NEGATIVE mg/dL
Specific Gravity, Urine: 1.014 (ref 1.005–1.030)
pH: 5 (ref 5.0–8.0)

## 2020-11-03 LAB — LIPASE, BLOOD: Lipase: 39 U/L (ref 11–51)

## 2020-11-03 LAB — I-STAT BETA HCG BLOOD, ED (MC, WL, AP ONLY): I-stat hCG, quantitative: <5 m[IU]/mL (ref <5)

## 2020-11-03 MED ORDER — IBUPROFEN 800 MG PO TABS: 800.0000 mg | ORAL_TABLET | Freq: Three times a day (TID) | ORAL | 0 refills | Status: DC | PRN

## 2020-11-03 MED ORDER — IBUPROFEN 800 MG PO TABS
800.0000 mg | ORAL_TABLET | Freq: Once | ORAL | Status: AC
  Administered 2020-11-03: 800 mg via ORAL
  Filled 2020-11-03: qty 1

## 2020-11-03 NOTE — ED Provider Notes (Signed)
[facility] EMERGENCY DEPARTMENT Provider Note  CSN: [id number] Arrival date & time: 11/03/20 0820    History Chief Complaint  Patient presents with  . Pelvic Pain    HPI  Shelby Ellis is a 40 y.o. female reports several days of intermittent R pelvic pain, started about 3 days ago, severe sharp and caused her to pass out in her bathroom. She has had a previous L sided ectopic pregnancy which presented similarly, s/p L tube removal. Since the onset pain has been coming and going, no particular provoking or relieving factors. She reports pain has now been radiating into her R back. She has had similar pains before with her menstrual cycle since she had her ectopic surgery. She has been seeing a fertility doctor but has not had a recent embryo transfer due to reported salpinginitis/endometritis, she has taken antibiotics for same. She denies any fever, some nausea, no vomiting. No diarrhea. She was here in the ED late last night/early this morning but left prior to seeing a provider. She had normal CBC, CMP with mildly elevated Cr, normal lipase. HCG was neg.    No past medical history on file.  Past Surgical History:  Procedure Laterality Date  . ectopic pregnacy  02/2014    Family History  Problem Relation Age of Onset  . Asthma Mother   . Hypertension Father   . Heart block Father     Social History   Tobacco Use  . Smoking status: Never Smoker  . Smokeless tobacco: Never Used  Vaping Use  . Vaping Use: Never used  Substance Use Topics  . Alcohol use: Yes    Comment: socially  . Drug use: Never     Home Medications Prior to Admission medications   Medication Sig Start Date End Date Taking? Authorizing Provider  ibuprofen (ADVIL) 800 MG tablet Take 1 tablet (800 mg total) by mouth every 8 (eight) hours as needed. 11/03/20  Yes Pollyann Savoy, MD  butalbital-acetaminophen-caffeine (FIORICET) [phone] MG tablet Take 1 tablet by mouth every 6 (six) hours as needed for  headache or migraine. 10/09/20   Cirigliano, Jearld Lesch, DO  EPINEPHrine 0.3 mg/0.3 mL IJ SOAJ injection Inject 0.3 mg into the muscle as needed for anaphylaxis. 10/09/20   Cirigliano, Jearld Lesch, DO  Fluticasone-Salmeterol (ADVAIR) 100-50 MCG/DOSE AEPB Inhale 1 puff into the lungs 2 (two) times daily. 10/09/20   Overton Mam, DO  Multiple Vitamin (MULTIVITAMIN) capsule Take 1 capsule by mouth daily.    [provider]     Allergies    Shellfish allergy   Review of Systems   Review of Systems A comprehensive review of systems was completed and negative except as noted in HPI.    Physical Exam BP (!) 124/107   Pulse 85   Temp 97.7 F (36.5 C) (Oral)   Resp 18   SpO2 99%   Physical Exam Vitals and nursing note reviewed.  Constitutional:      Appearance: Normal appearance.  HENT:     Head: Normocephalic and atraumatic.     Nose: Nose normal.     Mouth/Throat:     Mouth: Mucous membranes are moist.  Eyes:     Extraocular Movements: Extraocular movements intact.     Conjunctiva/sclera: Conjunctivae normal.  Cardiovascular:     Rate and Rhythm: Normal rate.  Pulmonary:     Effort: Pulmonary effort is normal.     Breath sounds: Normal breath sounds.  Abdominal:     General:  Abdomen is flat.     Palpations: Abdomen is soft. There is no mass.     Tenderness: There is abdominal tenderness (R pelvis/RLQ). There is no guarding.  Musculoskeletal:        General: No swelling. Normal range of motion.     Cervical back: Neck supple.  Skin:    General: Skin is warm and dry.  Neurological:     General: No focal deficit present.     Mental Status: She is alert.  Psychiatric:        Mood and Affect: Mood normal.      ED Results / Procedures / Treatments   Labs (all labs ordered are listed, but only abnormal results are displayed) Labs Reviewed  URINALYSIS, ROUTINE W REFLEX MICROSCOPIC - Abnormal; Notable for the following components:      Result Value   Hgb urine  dipstick MODERATE (*)    All other components within normal limits  PREGNANCY, URINE    EKG None  Radiology CT Renal Stone Study  Result Date: 11/03/2020 CLINICAL DATA:  Flank pain, kidney stone suspected EXAM: CT ABDOMEN AND PELVIS WITHOUT CONTRAST TECHNIQUE: Multidetector CT imaging of the abdomen and pelvis was performed following the standard protocol without IV contrast. COMPARISON:  None. FINDINGS: Lower chest: Mild basilar atelectasis. Small bilateral pleural effusions. Hepatobiliary: Liver with smooth contour. No visible lesion on noncontrast imaging. No pericholecystic stranding. No gross biliary duct distension. Pancreas: Pancreas with normal contour, no sign of inflammation. Spleen: Normal Adrenals/Urinary Tract: Normal adrenal glands. Small amount of soft tissue in the RIGHT renal fossa measuring 2.2 x 0.8 cm presumably atretic RIGHT kidney. Smooth contour of the LEFT kidney which shows normal size, no hydronephrosis. No visible renal lesion on noncontrast imaging. No nephrolithiasis. No ureteral calculi. Urinary bladder with smooth contour. Stomach/Bowel: No acute small bowel process. Normal appendix. Stomach under distended limiting assessment. Stool throughout the proximal colon. No adjacent inflammation. Distal colon under distended. Vascular/Lymphatic: Normal caliber abdominal aorta. There is no gastrohepatic or hepatoduodenal ligament lymphadenopathy. No retroperitoneal or mesenteric lymphadenopathy. No pelvic sidewall lymphadenopathy. Reproductive: Uterus and adnexa unremarkable by CT. Other: No free air.  No ascites. Musculoskeletal: No acute musculoskeletal process. IMPRESSION: 1. No nephrolithiasis or ureteral calculi. 2. Small amount of soft tissue in the RIGHT renal fossa measuring 2.2 x 0.8 cm presumably atretic RIGHT kidney. Normal noncontrast appearance of the LEFT kidney. 3. Small bilateral pleural effusions and basilar atelectasis. Electronically Signed   By: Donzetta Kohut  M.D.   On: 11/03/2020 14:44   US PELVIC COMPLETE W TRANSVAGINAL AND TORSION R/O  Result Date: 11/03/2020 CLINICAL DATA:  Right lower quadrant pain for 6 months EXAM: TRANSABDOMINAL AND TRANSVAGINAL ULTRASOUND OF PELVIS DOPPLER ULTRASOUND OF OVARIES TECHNIQUE: Both transabdominal and transvaginal ultrasound examinations of the pelvis were performed. Transabdominal technique was performed for global imaging of the pelvis including uterus, ovaries, adnexal regions, and pelvic cul-de-sac. It was necessary to proceed with endovaginal exam following the transabdominal exam to visualize the endometrium and ovaries. Color and duplex Doppler ultrasound was utilized to evaluate blood flow to the ovaries. COMPARISON:  None. FINDINGS: Uterus Measurements: 9.4 x 3.5 x 4.6 cm = volume: 78.5 mL. No fibroids or other mass visualized. Endometrium Thickness: 9 mm.  No focal abnormality visualized. Right ovary Measurements: 2.3 x 1.9 x 1.6 cm = volume: 3.4 mL. Normal appearance/no adnexal mass. Left ovary Measurements: 3 x 1.8 x 2 cm = volume: 5.6 mL. Normal appearance/no adnexal mass. Pulsed Doppler evaluation of both  ovaries demonstrates normal low-resistance arterial and venous waveforms. Other findings Small amount of pelvic free fluid. IMPRESSION: 1. No ovarian torsion. 2. No acute pelvic abnormality. 3. Small amount of pelvic free fluid likely physiologic. Electronically Signed   By: Elige Ko   On: 11/03/2020 16:32    Procedures Procedures  Medications Ordered in the ED Medications  ibuprofen (ADVIL) tablet 800 mg (800 mg Oral Given 11/03/20 1410)     MDM Rules/Calculators/A&P MDM UA today with moderate Hgb, given intermittent pain radiating to flank, will send for CT to rule out kidney stone. If neg, will likely need [place] to eval ovarian torsion, although this is felt to be less likely. Patient declines pain medications at this time.  ED Course  I have reviewed the triage vital signs and the nursing  notes.  Pertinent labs & imaging results that were available during my care of the patient were reviewed by me and considered in my medical decision making (see chart for details).  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 Awaiting Rads read.  [CS]  1522 CT results reviewed, discussed absence of R kidney with patient. Will send for [place] to rule out ovarian cause of pain. She reports improvement after Motrin.  [CS]  1657 [place] neg. Patient reassured no emergent cause of pain found. Recommend Ob/Gyn follow up, Motrin for pain. RTED for any other concerns.  [CS]    Clinical Course User Index [CS] Pollyann Savoy, MD    Final Clinical Impression(s) / ED Diagnoses Final diagnoses:  Pelvic pain    Rx / DC Orders ED Discharge Orders         Ordered    ibuprofen (ADVIL) 800 MG tablet  Every 8 hours PRN        11/03/20 1659           Pollyann Savoy, MD 11/03/20 1659

## 2020-11-03 NOTE — ED Triage Notes (Signed)
Patient reports to the ER for right sided pelvic pain. Patient reports sharp shooting pain in the area. Patient reports she has had a near syncopal event a week ago for the same pain. Patient reports she has pain that radiates to her back as well. Patient reports she does not have her period anymore

## 2020-11-03 NOTE — ED Notes (Signed)
Pt called 3x for room placement. Eloped from waiting area.  

## 2020-11-03 NOTE — ED Triage Notes (Signed)
Pt reports right sided pelvic/ ovarian pain. Pt has left tube removed after ruptured ectopic pregnancy. IVF patient. Pain has been on going for approx 1 week.

## 2020-11-13 ENCOUNTER — Encounter: Payer: Self-pay | Admitting: Family Medicine

## 2020-11-13 ENCOUNTER — Ambulatory Visit: Payer: Managed Care, Other (non HMO) | Admitting: Family Medicine

## 2020-11-13 ENCOUNTER — Other Ambulatory Visit: Payer: Self-pay | Admitting: Family Medicine

## 2020-11-13 DIAGNOSIS — E559 Vitamin D deficiency, unspecified: Secondary | ICD-10-CM | POA: Insufficient documentation

## 2020-11-13 MED ORDER — VITAMIN D (ERGOCALCIFEROL) 1.25 MG (50000 UNIT) PO CAPS: 50000.0000 [IU] | ORAL_CAPSULE | ORAL | 2 refills | Status: DC

## 2020-11-18 ENCOUNTER — Other Ambulatory Visit: Payer: Self-pay

## 2020-11-18 ENCOUNTER — Encounter: Payer: Self-pay | Admitting: Obstetrics and Gynecology

## 2020-11-18 ENCOUNTER — Ambulatory Visit (INDEPENDENT_AMBULATORY_CARE_PROVIDER_SITE_OTHER): Payer: Managed Care, Other (non HMO) | Admitting: Obstetrics and Gynecology

## 2020-11-18 VITALS — BP 127/84 | HR 91 | Ht 63.0 in | Wt 174.0 lb

## 2020-11-18 DIAGNOSIS — R102 Pelvic and perineal pain: Secondary | ICD-10-CM | POA: Diagnosis not present

## 2020-11-18 DIAGNOSIS — G8929 Other chronic pain: Secondary | ICD-10-CM | POA: Diagnosis not present

## 2020-11-18 NOTE — Progress Notes (Signed)
NGYN patient present for Annual per notes and to establish care.  Last Pap:03/06/2020 WNL Neg HPV @ [facility]   Mammogram: 02/28/2020 WNL repeat in 1 yr . STD Screening: Family Hx of Breast Cancer : None   CC: Trying to conceive  Pelvic Pain pt has been dealing with pain since 03/2020. Pt notes pain w/ cycles. Pt states she fainted last month Has specimen results from fertility office had Bx done. Pt notes pain is more on the right side.

## 2020-11-18 NOTE — Progress Notes (Signed)
40 yo P0010 with BMI 30 presenting today for evalution of RLQ pain. Patient with history of ectopic pregnancy and recently s/p bilateral salpingectomy in 02/2020 per REI recommendations due to chronic hydrosalpinx. She reports onset of RLQ pain soon after her [facility] surgery. Pain did not improve after 3 months post op. She was later diagnosed with chronic endometritis and treated with a 10-day course of doxy/flagyl. Patient describes the pain as intermittent, burning pain localized in her RLQ, radiating to her lower back and at times her left leg. She describes her pain as being similar to her ruptured ectopic pregnancy. Patient states her pain is present at the time of her cycle. She started taking natural antiinflammatory agents such as tumeric and omega 3 which seems to have eased some of her pain. Patient is fearful of taking ibuprofen as she only has 1 kidney.  Past Medical History:  Diagnosis Date  . Asthma   . Endometritis    Past Surgical History:  Procedure Laterality Date  . ectopic pregnacy  02/2014   Family History  Problem Relation Age of Onset  . Asthma Mother   . Hypertension Father   . Heart block Father    Social History   Tobacco Use  . Smoking status: Never Smoker  . Smokeless tobacco: Never Used  Vaping Use  . Vaping Use: Never used  Substance Use Topics  . Alcohol use: Not Currently  . Drug use: Never   ROS See pertinent in HPI. All other systems reviewed and non contributory  Blood pressure 127/84, pulse 91, height 5\' 3"  (1.6 m), weight 174 lb (78.9 kg). GENERAL: Well-developed, well-nourished female in no acute distress.  ABDOMEN: Soft, nontender, nondistended. No organomegaly. PELVIC: Normal external female genitalia. Vagina is pink and rugated.  Normal discharge. Normal appearing cervix. Uterus is normal in size.  No adnexal mass. RLQ tenderness to deep palpation EXTREMITIES: No cyanosis, clubbing, or edema, 2+ distal pulses.  CT Renal Stone Study  Result  Date: 11/03/2020 CLINICAL DATA:  Flank pain, kidney stone suspected EXAM: CT ABDOMEN AND PELVIS WITHOUT CONTRAST TECHNIQUE: Multidetector CT imaging of the abdomen and pelvis was performed following the standard protocol without IV contrast. COMPARISON:  None. FINDINGS: Lower chest: Mild basilar atelectasis. Small bilateral pleural effusions. Hepatobiliary: Liver with smooth contour. No visible lesion on noncontrast imaging. No pericholecystic stranding. No gross biliary duct distension. Pancreas: Pancreas with normal contour, no sign of inflammation. Spleen: Normal Adrenals/Urinary Tract: Normal adrenal glands. Small amount of soft tissue in the RIGHT renal fossa measuring 2.2 x 0.8 cm presumably atretic RIGHT kidney. Smooth contour of the LEFT kidney which shows normal size, no hydronephrosis. No visible renal lesion on noncontrast imaging. No nephrolithiasis. No ureteral calculi. Urinary bladder with smooth contour. Stomach/Bowel: No acute small bowel process. Normal appendix. Stomach under distended limiting assessment. Stool throughout the proximal colon. No adjacent inflammation. Distal colon under distended. Vascular/Lymphatic: Normal caliber abdominal aorta. There is no gastrohepatic or hepatoduodenal ligament lymphadenopathy. No retroperitoneal or mesenteric lymphadenopathy. No pelvic sidewall lymphadenopathy. Reproductive: Uterus and adnexa unremarkable by CT. Other: No free air.  No ascites. Musculoskeletal: No acute musculoskeletal process. IMPRESSION: 1. No nephrolithiasis or ureteral calculi. 2. Small amount of soft tissue in the RIGHT renal fossa measuring 2.2 x 0.8 cm presumably atretic RIGHT kidney. Normal noncontrast appearance of the LEFT kidney. 3. Small bilateral pleural effusions and basilar atelectasis. Electronically Signed   By: 01/01/2021 M.D.   On: 11/03/2020 14:44   01/01/2021 PELVIC COMPLETE W TRANSVAGINAL  AND TORSION R/O  Result Date: 11/03/2020 CLINICAL DATA:  Right lower quadrant pain  for 6 months EXAM: TRANSABDOMINAL AND TRANSVAGINAL ULTRASOUND OF PELVIS DOPPLER ULTRASOUND OF OVARIES TECHNIQUE: Both transabdominal and transvaginal ultrasound examinations of the pelvis were performed. Transabdominal technique was performed for global imaging of the pelvis including uterus, ovaries, adnexal regions, and pelvic cul-de-sac. It was necessary to proceed with endovaginal exam following the transabdominal exam to visualize the endometrium and ovaries. Color and duplex Doppler ultrasound was utilized to evaluate blood flow to the ovaries. COMPARISON:  None. FINDINGS: Uterus Measurements: 9.4 x 3.5 x 4.6 cm = volume: 78.5 mL. No fibroids or other mass visualized. Endometrium Thickness: 9 mm.  No focal abnormality visualized. Right ovary Measurements: 2.3 x 1.9 x 1.6 cm = volume: 3.4 mL. Normal appearance/no adnexal mass. Left ovary Measurements: 3 x 1.8 x 2 cm = volume: 5.6 mL. Normal appearance/no adnexal mass. Pulsed Doppler evaluation of both ovaries demonstrates normal low-resistance arterial and venous waveforms. Other findings Small amount of pelvic free fluid. IMPRESSION: 1. No ovarian torsion. 2. No acute pelvic abnormality. 3. Small amount of pelvic free fluid likely physiologic. Electronically Signed   By: Elige Ko   On: 11/03/2020 16:32     A/P 40 yo s/p bilateral salpingectomy in 02/2020 with chronic pelvic pain - Described several etiologies of her pain. Could be post tubal ligation syndrome, could be scar tissue from recent surgery - Patient referred to physical therapy - patient encouraged to take ibuprofen as prescribed for pain - Patient encouraged to follow up with Dr. April Manson for embryo transfer

## 2020-12-02 ENCOUNTER — Encounter: Payer: Self-pay | Admitting: Family Medicine

## 2020-12-06 ENCOUNTER — Other Ambulatory Visit: Payer: Self-pay

## 2020-12-06 ENCOUNTER — Encounter: Payer: Self-pay | Admitting: Plastic Surgery

## 2020-12-06 ENCOUNTER — Ambulatory Visit: Payer: Managed Care, Other (non HMO) | Admitting: Plastic Surgery

## 2020-12-06 DIAGNOSIS — L989 Disorder of the skin and subcutaneous tissue, unspecified: Secondary | ICD-10-CM | POA: Diagnosis not present

## 2020-12-06 NOTE — Progress Notes (Signed)
Patient ID: Shelby Ellis, female    DOB: [birth year], 40 y.o.   MRN: [id number]   Chief Complaint  Patient presents with  . Advice Only  . Skin Problem    Patient is a 40 year old female here for evaluation of her nose.  She has a lesion on her right nasal ala.  She has had it for several years.  It is getting larger.  It is sometimes tender and sometimes gets red and inflamed.  She has had it injected with Kenalog in the past.  She was told it was a keloid.  She compares it to a scar on her abdomen.  She got bad after excision of a changing skin lesion.  On exam it does not look like a keloid at all but a nicely healed scar.  The one on her nose looks more cystlike.  She is otherwise in good health and has no other areas of concern.   Review of Systems  Constitutional: Negative.   HENT: Negative.   Eyes: Negative.   Respiratory: Negative.  Negative for chest tightness and shortness of breath.   Cardiovascular: Negative.   Genitourinary: Negative.   Hematological: Negative.   Psychiatric/Behavioral: Negative.     Past Medical History:  Diagnosis Date  . Asthma   . Endometritis     Past Surgical History:  Procedure Laterality Date  . ectopic pregnacy  02/2014      Current Outpatient Medications:  .  butalbital-acetaminophen-caffeine (FIORICET) 50-325-40 MG tablet, Take 1 tablet by mouth every 6 (six) hours as needed for headache or migraine., Disp: 60 tablet, Rfl: 0 .  EPINEPHrine 0.3 mg/0.3 mL IJ SOAJ injection, Inject 0.3 mg into the muscle as needed for anaphylaxis., Disp: 1 each, Rfl: 2 .  Fluticasone-Salmeterol (ADVAIR) 100-50 MCG/DOSE AEPB, Inhale 1 puff into the lungs 2 (two) times daily., Disp: 60 each, Rfl: 3 .  Multiple Vitamin (MULTIVITAMIN) capsule, Take 1 capsule by mouth daily., Disp: , Rfl:  .  Omega-3 Fatty Acids (OMEGA 3 PO), Take by mouth., Disp: , Rfl:  .  Vitamin D, Ergocalciferol, (DRISDOL) 1.25 MG (50000 UNIT) CAPS capsule, Take 1 capsule (50,000 Units  total) by mouth every 7 (seven) days., Disp: 5 capsule, Rfl: 2 .  ibuprofen (ADVIL) 800 MG tablet, Take 1 tablet (800 mg total) by mouth every 8 (eight) hours as needed., Disp: 30 tablet, Rfl: 0   Objective:   Vitals:   12/06/20 1103  BP: 98/60  Pulse: 75  SpO2: 99%    Physical Exam Vitals and nursing note reviewed.  Constitutional:      Appearance: Normal appearance.  HENT:     Head: Normocephalic and atraumatic.   Cardiovascular:     Rate and Rhythm: Normal rate.     Pulses: Normal pulses.  Pulmonary:     Effort: Pulmonary effort is normal.  Musculoskeletal:        General: Swelling present. No deformity or signs of injury. Normal range of motion.  Skin:    General: Skin is warm.     Capillary Refill: Capillary refill takes less than 2 seconds.  Neurological:     General: No focal deficit present.     Mental Status: She is alert and oriented to person, place, and time.  Psychiatric:        Mood and Affect: Mood normal.        Behavior: Behavior normal.        Thought Content: Thought content normal.  Assessment & Plan:  Changing skin lesion  Recommend excision of changing skin lesion nose.  We can do this in the office and I explained to the patient that there will be a scar.  She would like to proceed. Pictures were obtained of the patient and placed in the chart with the patient's or guardian's permission.   Alena Bills Laurieann Friddle, DO

## 2020-12-30 ENCOUNTER — Other Ambulatory Visit: Payer: Self-pay

## 2020-12-30 ENCOUNTER — Ambulatory Visit: Payer: Managed Care, Other (non HMO) | Attending: Obstetrics and Gynecology | Admitting: Physical Therapy

## 2020-12-30 DIAGNOSIS — R252 Cramp and spasm: Secondary | ICD-10-CM | POA: Insufficient documentation

## 2020-12-30 NOTE — Therapy (Signed)
[facility] [address], [address] [place], [place], [address] Phone: [phone]   Fax:  [phone]  Physical Therapy Evaluation  Patient Details  Name: Shelby Ellis MRN: [id number] Date of Birth: 05-14-1981 No data recorded  Encounter Date: 12/30/2020    Past Medical History:  Diagnosis Date  . Asthma   . Endometritis     Past Surgical History:  Procedure Laterality Date  . ectopic pregnacy  02/2014    There were no vitals filed for this visit.                    Objective measurements completed on examination: See above findings.                            Plan - 12/30/20 1149    Clinical Impression Statement Pt arrived and then left after discussing the insurance benefits/cost of therapy with her spouse           Patient will benefit from skilled therapeutic intervention in order to improve the following deficits and impairments:     Visit Diagnosis: Cramp and spasm     Problem List Patient Active Problem List   Diagnosis Date Noted  . Changing skin lesion 12/06/2020  . Vitamin D deficiency 11/13/2020  . Hydrosalpinx 05/23/2019  . ASCUS of cervix with negative high risk HPV 03/16/2019  . Infertility, female 02/07/2019  . Chronic midline low back pain without sciatica 09/26/2018  . Left leg weakness 09/26/2018  . Environmental allergies 03/22/2018  . History of asthma 03/22/2018  . Past history of nut allergy 03/22/2018  . History of HPV infection 01/04/2018  . History of ectopic pregnancy 12/09/2017  . History of unilateral salpingectomy 12/09/2017    Junious Silk, PT 12/30/2020, 11:50 AM  [facility] [address] [place], [place], [address] Phone: [phone]   Fax:  [phone]  Name: Shelby Ellis MRN: [id number] Date of Birth: [birth year]

## 2021-02-17 ENCOUNTER — Ambulatory Visit: Payer: Managed Care, Other (non HMO) | Admitting: Obstetrics and Gynecology

## 2021-02-25 ENCOUNTER — Other Ambulatory Visit (HOSPITAL_COMMUNITY)
Admission: RE | Admit: 2021-02-25 | Discharge: 2021-02-25 | Disposition: A | Discharge status: Home / Self Care | Payer: Managed Care, Other (non HMO) | Source: Ambulatory Visit | Attending: Plastic Surgery | Admitting: Plastic Surgery

## 2021-02-25 ENCOUNTER — Ambulatory Visit: Payer: Managed Care, Other (non HMO) | Admitting: Plastic Surgery

## 2021-02-25 ENCOUNTER — Other Ambulatory Visit: Payer: Self-pay

## 2021-02-25 ENCOUNTER — Encounter: Payer: Self-pay | Admitting: Plastic Surgery

## 2021-02-25 VITALS — BP 111/79 | HR 76

## 2021-02-25 DIAGNOSIS — L989 Disorder of the skin and subcutaneous tissue, unspecified: Secondary | ICD-10-CM | POA: Diagnosis present

## 2021-02-25 NOTE — Progress Notes (Signed)
Procedure Note  Preoperative Dx: changing skin lesion of nose  Postoperative Dx: Same  Procedure: Excision of changing skin lesion of nose 1 cm  Anesthesia: Lidocaine 1% with 1:100,000 epinepherine  Description of Procedure: Risks and complications were explained to the patient and mom.  Consent was confirmed and the patient understands the risks and benefits.  The potential complications and alternatives were explained and the patient consents.  The patient expressed understanding the option of not having the procedure and the risks of a scar.  Time out was called and all information was confirmed to be correct.    The area was prepped and drapped.  Lidocaine 1% with epinepherine was injected in the subcutaneous area.  After waiting several minutes for the local to take affect a #15 blade was used to excise the area in an eliptical pattern.  The skin edges were reapproximated with 5-0 Monocryl subcuticular running closure.  A dressing was applied.  The patient was given instructions on how to care for the area and a follow up appointment.  Azuree tolerated the procedure well and there were no complications. The specimen was then sent to pathology.

## 2021-02-26 LAB — SURGICAL PATHOLOGY

## 2021-03-07 DIAGNOSIS — Z319 Encounter for procreative management, unspecified: Secondary | ICD-10-CM | POA: Diagnosis not present

## 2021-03-07 DIAGNOSIS — E288 Other ovarian dysfunction: Secondary | ICD-10-CM | POA: Diagnosis not present

## 2021-03-10 ENCOUNTER — Other Ambulatory Visit: Payer: Self-pay

## 2021-03-11 ENCOUNTER — Ambulatory Visit: Payer: Managed Care, Other (non HMO) | Admitting: Plastic Surgery

## 2021-03-11 ENCOUNTER — Encounter: Payer: Self-pay | Admitting: Plastic Surgery

## 2021-03-11 ENCOUNTER — Other Ambulatory Visit: Payer: Self-pay

## 2021-03-11 DIAGNOSIS — L989 Disorder of the skin and subcutaneous tissue, unspecified: Secondary | ICD-10-CM

## 2021-03-11 NOTE — Progress Notes (Signed)
The patient is a 40 year old female here for follow-up on a nose excision of the lesion.  The lesion came back as an inclusion cyst of the sutures today.  It is healing well.  No sign of infection.  I recommend either Mederma or skinUva.  Must be really good about avoiding sun exposure to the area.  Pictures were obtained of the patient and placed in the chart with the patient's or guardian's permission.

## 2021-03-13 DIAGNOSIS — Z3141 Encounter for fertility testing: Secondary | ICD-10-CM | POA: Diagnosis not present

## 2021-03-13 DIAGNOSIS — E288 Other ovarian dysfunction: Secondary | ICD-10-CM | POA: Diagnosis not present

## 2021-03-25 ENCOUNTER — Ambulatory Visit: Payer: Managed Care, Other (non HMO) | Admitting: Obstetrics and Gynecology

## 2021-04-11 ENCOUNTER — Ambulatory Visit: Payer: Managed Care, Other (non HMO) | Admitting: Obstetrics and Gynecology

## 2021-04-21 DIAGNOSIS — N979 Female infertility, unspecified: Secondary | ICD-10-CM | POA: Diagnosis not present

## 2021-04-21 DIAGNOSIS — Z3183 Encounter for assisted reproductive fertility procedure cycle: Secondary | ICD-10-CM | POA: Diagnosis not present

## 2021-04-23 DIAGNOSIS — N979 Female infertility, unspecified: Secondary | ICD-10-CM | POA: Diagnosis not present

## 2021-04-23 DIAGNOSIS — Z3183 Encounter for assisted reproductive fertility procedure cycle: Secondary | ICD-10-CM | POA: Diagnosis not present

## 2021-04-29 DIAGNOSIS — Z3183 Encounter for assisted reproductive fertility procedure cycle: Secondary | ICD-10-CM | POA: Diagnosis not present

## 2021-04-29 DIAGNOSIS — N979 Female infertility, unspecified: Secondary | ICD-10-CM | POA: Diagnosis not present

## 2021-05-02 ENCOUNTER — Other Ambulatory Visit: Payer: Self-pay | Admitting: Family Medicine

## 2021-05-02 DIAGNOSIS — E559 Vitamin D deficiency, unspecified: Secondary | ICD-10-CM

## 2021-05-22 DIAGNOSIS — Z3200 Encounter for pregnancy test, result unknown: Secondary | ICD-10-CM | POA: Diagnosis not present

## 2021-05-24 DIAGNOSIS — O039 Complete or unspecified spontaneous abortion without complication: Secondary | ICD-10-CM | POA: Diagnosis not present

## 2021-05-24 DIAGNOSIS — Z91013 Allergy to seafood: Secondary | ICD-10-CM | POA: Diagnosis not present

## 2021-05-24 DIAGNOSIS — J45909 Unspecified asthma, uncomplicated: Secondary | ICD-10-CM | POA: Diagnosis not present

## 2021-05-24 DIAGNOSIS — O00101 Right tubal pregnancy without intrauterine pregnancy: Secondary | ICD-10-CM | POA: Diagnosis not present

## 2021-05-25 DIAGNOSIS — N7011 Chronic salpingitis: Secondary | ICD-10-CM | POA: Diagnosis not present

## 2021-05-25 DIAGNOSIS — O00101 Right tubal pregnancy without intrauterine pregnancy: Secondary | ICD-10-CM | POA: Diagnosis not present

## 2021-06-04 DIAGNOSIS — Z3200 Encounter for pregnancy test, result unknown: Secondary | ICD-10-CM | POA: Diagnosis not present

## 2021-06-04 DIAGNOSIS — Z3201 Encounter for pregnancy test, result positive: Secondary | ICD-10-CM | POA: Diagnosis not present

## 2021-06-04 DIAGNOSIS — N971 Female infertility of tubal origin: Secondary | ICD-10-CM | POA: Diagnosis not present

## 2021-06-04 DIAGNOSIS — N7011 Chronic salpingitis: Secondary | ICD-10-CM | POA: Diagnosis not present

## 2021-06-09 DIAGNOSIS — Z3200 Encounter for pregnancy test, result unknown: Secondary | ICD-10-CM | POA: Diagnosis not present

## 2021-06-09 DIAGNOSIS — N7011 Chronic salpingitis: Secondary | ICD-10-CM | POA: Diagnosis not present

## 2021-06-09 DIAGNOSIS — Z3201 Encounter for pregnancy test, result positive: Secondary | ICD-10-CM | POA: Diagnosis not present

## 2021-06-09 DIAGNOSIS — N971 Female infertility of tubal origin: Secondary | ICD-10-CM | POA: Diagnosis not present

## 2021-06-18 DIAGNOSIS — N971 Female infertility of tubal origin: Secondary | ICD-10-CM | POA: Diagnosis not present

## 2021-06-18 DIAGNOSIS — Z3200 Encounter for pregnancy test, result unknown: Secondary | ICD-10-CM | POA: Diagnosis not present

## 2021-06-18 DIAGNOSIS — Z31430 Encounter of female for testing for genetic disease carrier status for procreative management: Secondary | ICD-10-CM | POA: Diagnosis not present

## 2021-06-18 DIAGNOSIS — N7011 Chronic salpingitis: Secondary | ICD-10-CM | POA: Diagnosis not present

## 2021-06-20 DIAGNOSIS — N971 Female infertility of tubal origin: Secondary | ICD-10-CM | POA: Diagnosis not present

## 2021-06-20 DIAGNOSIS — N7011 Chronic salpingitis: Secondary | ICD-10-CM | POA: Diagnosis not present

## 2021-06-20 DIAGNOSIS — O0281 Inappropriate change in quantitative human chorionic gonadotropin (hCG) in early pregnancy: Secondary | ICD-10-CM | POA: Diagnosis not present

## 2021-06-27 DIAGNOSIS — Z3201 Encounter for pregnancy test, result positive: Secondary | ICD-10-CM | POA: Diagnosis not present

## 2021-06-27 DIAGNOSIS — Z3183 Encounter for assisted reproductive fertility procedure cycle: Secondary | ICD-10-CM | POA: Diagnosis not present

## 2021-06-27 DIAGNOSIS — Z3200 Encounter for pregnancy test, result unknown: Secondary | ICD-10-CM | POA: Diagnosis not present

## 2021-07-04 DIAGNOSIS — Z3200 Encounter for pregnancy test, result unknown: Secondary | ICD-10-CM | POA: Diagnosis not present

## 2021-07-04 DIAGNOSIS — Z3201 Encounter for pregnancy test, result positive: Secondary | ICD-10-CM | POA: Diagnosis not present

## 2021-07-10 DIAGNOSIS — Z3200 Encounter for pregnancy test, result unknown: Secondary | ICD-10-CM | POA: Diagnosis not present

## 2021-07-10 DIAGNOSIS — Z3201 Encounter for pregnancy test, result positive: Secondary | ICD-10-CM | POA: Diagnosis not present

## 2021-11-20 ENCOUNTER — Other Ambulatory Visit: Payer: Self-pay | Admitting: Family Medicine

## 2021-11-20 DIAGNOSIS — Z1231 Encounter for screening mammogram for malignant neoplasm of breast: Secondary | ICD-10-CM

## 2021-11-27 ENCOUNTER — Ambulatory Visit
Admission: RE | Admit: 2021-11-27 | Discharge: 2021-11-27 | Disposition: A | Discharge status: Home / Self Care | Payer: BC Managed Care – PPO | Source: Ambulatory Visit

## 2021-11-27 DIAGNOSIS — Z1231 Encounter for screening mammogram for malignant neoplasm of breast: Secondary | ICD-10-CM | POA: Diagnosis not present

## 2022-01-07 ENCOUNTER — Ambulatory Visit (INDEPENDENT_AMBULATORY_CARE_PROVIDER_SITE_OTHER): Payer: BC Managed Care – PPO | Admitting: Family Medicine

## 2022-01-07 ENCOUNTER — Encounter: Payer: Self-pay | Admitting: Family Medicine

## 2022-01-07 VITALS — BP 124/82 | HR 82 | Temp 97.1°F | Ht 63.0 in | Wt 181.6 lb

## 2022-01-07 DIAGNOSIS — R058 Other specified cough: Secondary | ICD-10-CM

## 2022-01-07 DIAGNOSIS — Z0000 Encounter for general adult medical examination without abnormal findings: Secondary | ICD-10-CM

## 2022-01-07 DIAGNOSIS — G43909 Migraine, unspecified, not intractable, without status migrainosus: Secondary | ICD-10-CM | POA: Diagnosis not present

## 2022-01-07 DIAGNOSIS — N943 Premenstrual tension syndrome: Secondary | ICD-10-CM | POA: Diagnosis not present

## 2022-01-07 DIAGNOSIS — Z0001 Encounter for general adult medical examination with abnormal findings: Secondary | ICD-10-CM | POA: Diagnosis not present

## 2022-01-07 LAB — CBC
HCT: 38 % (ref 36.0–46.0)
Hemoglobin: 12.5 g/dL (ref 12.0–15.0)
MCHC: 33 g/dL (ref 30.0–36.0)
MCV: 92.4 fl (ref 78.0–100.0)
Platelets: 336 10*3/uL (ref 150.0–400.0)
RBC: 4.11 Mil/uL (ref 3.87–5.11)
RDW: 13.6 % (ref 11.5–15.5)
WBC: 5.9 10*3/uL (ref 4.0–10.5)

## 2022-01-07 LAB — COMPREHENSIVE METABOLIC PANEL
ALT: 16 U/L (ref 0–35)
AST: 20 U/L (ref 0–37)
Albumin: 4.2 g/dL (ref 3.5–5.2)
Alkaline Phosphatase: 43 U/L (ref 39–117)
BUN: 13 mg/dL (ref 6–23)
CO2: 24 mEq/L (ref 19–32)
Calcium: 9.1 mg/dL (ref 8.4–10.5)
Chloride: 107 mEq/L (ref 96–112)
Creatinine, Ser: 0.77 mg/dL (ref 0.40–1.20)
GFR: 95.4 mL/min (ref >60.00)
Glucose, Bld: 89 mg/dL (ref 70–99)
Potassium: 4.9 mEq/L (ref 3.5–5.1)
Sodium: 138 mEq/L (ref 135–145)
Total Bilirubin: 0.3 mg/dL (ref 0.2–1.2)
Total Protein: 7.5 g/dL (ref 6.0–8.3)

## 2022-01-07 LAB — LIPID PANEL
Cholesterol: 225 mg/dL — ABNORMAL HIGH (ref 0–200)
HDL: 40.1 mg/dL (ref >39.00)
LDL Cholesterol: 155 mg/dL — ABNORMAL HIGH (ref 0–99)
NonHDL: 185.26
Total CHOL/HDL Ratio: 6
Triglycerides: 151 mg/dL — ABNORMAL HIGH (ref 0.0–149.0)
VLDL: 30.2 mg/dL (ref 0.0–40.0)

## 2022-01-07 LAB — HEMOGLOBIN A1C: Hgb A1c MFr Bld: 5.9 % (ref 4.6–6.5)

## 2022-01-07 MED ORDER — BUTALBITAL-APAP-CAFFEINE 50-325-40 MG PO TABS: 1.0000 | ORAL_TABLET | Freq: Four times a day (QID) | ORAL | 0 refills | Status: DC | PRN

## 2022-01-07 MED ORDER — BENZONATATE 200 MG PO CAPS: 200.0000 mg | ORAL_CAPSULE | Freq: Two times a day (BID) | ORAL | 0 refills | Status: DC | PRN

## 2022-01-07 MED ORDER — IBUPROFEN 800 MG PO TABS: 800.0000 mg | ORAL_TABLET | Freq: Three times a day (TID) | ORAL | 0 refills | Status: DC | PRN

## 2022-01-07 NOTE — Progress Notes (Signed)
? ?Established Patient Office Visit ? ?Subjective:  ?Patient ID: Shelby Ellis, female    DOB: 1981-07-13  Age: 41 y.o. MRN: [id number] ? ?CC:  ?Chief Complaint  ?Patient presents with  ? Transitions Of Care  ?  TOC from Dr. Salena Saner, no concerns. Patient fasting.   ? ? ?HPI ?Shelby Ellis presents for establishment of care by way of transfer.  History of migraine headaches.  She uses tablet at home rarely.  Prescription of 30 pills lasts her a year.  Significant abdominal pain during the first few days of menses.  Ibuprofen relieves.  Currently involved with IVF.  It is taken an emotional and physical toll on her.  She and her husband in charge ahead.  She is status post recent URI that has resolved.  She is left with a lingering cough.  Denies sputum production, fever or chills, wheezing or difficulty breathing ? ?Past Medical History:  ?Diagnosis Date  ? Asthma   ? Endometritis   ? ? ?Past Surgical History:  ?Procedure Laterality Date  ? ectopic pregnacy  02/2014  ? ? ?Family History  ?Problem Relation Age of Onset  ? Asthma Mother   ? Hypertension Father   ? Heart block Father   ? ? ?Social History  ? ?Socioeconomic History  ? Marital status: Married  ?  Spouse name: Not on file  ? Number of children: Not on file  ? Years of education: Not on file  ? Highest education level: Not on file  ?Occupational History  ? Not on file  ?Tobacco Use  ? Smoking status: Never  ? Smokeless tobacco: Never  ?Vaping Use  ? Vaping Use: Never used  ?Substance and Sexual Activity  ? Alcohol use: Not Currently  ? Drug use: Never  ? Sexual activity: Yes  ?  Partners: Male  ?Other Topics Concern  ? Not on file  ?Social History Narrative  ? Not on file  ? ?Social Determinants of Health  ? ?Financial Resource Strain: Not on file  ?Food Insecurity: Not on file  ?Transportation Needs: Not on file  ?Physical Activity: Not on file  ?Stress: Not on file  ?Social Connections: Not on file  ?Intimate Partner Violence: Not on file  ? ? ?Outpatient Medications  Prior to Visit  ?Medication Sig Dispense Refill  ? EPINEPHrine 0.3 mg/0.3 mL IJ SOAJ injection Inject 0.3 mg into the muscle as needed for anaphylaxis. 1 each 2  ? Fluticasone-Salmeterol (ADVAIR) 100-50 MCG/DOSE AEPB Inhale 1 puff into the lungs 2 (two) times daily. 60 each 3  ? Multiple Vitamin (MULTIVITAMIN) capsule Take 1 capsule by mouth daily.    ? Omega-3 Fatty Acids (OMEGA 3 PO) Take by mouth.    ? Prenatal MV-Min-Fe Fum-FA-DHA (PRENATAL 1 PO) Take by mouth.    ? butalbital-acetaminophen-caffeine (FIORICET) 50-325-40 MG tablet Take 1 tablet by mouth every 6 (six) hours as needed for headache or migraine. 60 tablet 0  ? ibuprofen (ADVIL) 800 MG tablet Take 1 tablet (800 mg total) by mouth every 8 (eight) hours as needed. 30 tablet 0  ? Vitamin D, Ergocalciferol, (DRISDOL) 1.25 MG (50000 UNIT) CAPS capsule Take 1 capsule (50,000 Units total) by mouth every 7 (seven) days. ** Need to schedule appt with new PCP before anymore refills given** (Patient not taking: Reported on 01/07/2022) 12 capsule 0  ? ?No facility-administered medications prior to visit.  ? ? ?Allergies  ?Allergen Reactions  ? Shellfish Allergy Other (See Comments)  ? ? ?ROS ?Review of Systems  ?  Constitutional: Negative.  Negative for chills, diaphoresis, fatigue, fever and unexpected weight change.  ?HENT: Negative.    ?Eyes:  Negative for photophobia and visual disturbance.  ?Respiratory: Negative.    ?Cardiovascular: Negative.   ?Gastrointestinal: Negative.   ?Endocrine: Negative for polyphagia and polyuria.  ?Genitourinary: Negative.   ?Musculoskeletal:  Negative for gait problem and joint swelling.  ?Neurological:  Positive for headaches. Negative for speech difficulty, weakness and light-headedness.  ? ?  01/07/2022  ? 11:01 AM 10/13/2019  ? 10:58 AM  ?Depression screen PHQ 2/9  ?Decreased Interest 0 0  ?Down, Depressed, Hopeless 0 0  ?PHQ - 2 Score 0 0  ? ? ? ?  ?Objective:  ?  ?Physical Exam ?Cardiovascular:  ?   Rate and Rhythm: Normal  rate.  ? ? ?BP 124/82 (BP Location: Right Arm, Patient Position: Sitting, Cuff Size: Large)   Pulse 82   Temp (!) 97.1 ?F (36.2 ?C) (Temporal)   Ht 5\' 3"  (1.6 m)   Wt 181 lb 9.6 oz (82.4 kg)   SpO2 97%   BMI 32.17 kg/m?  ?Wt Readings from Last 3 Encounters:  ?01/07/22 181 lb 9.6 oz (82.4 kg)  ?11/18/20 174 lb (78.9 kg)  ?11/03/20 173 lb (78.5 kg)  ? ? ? ?Health Maintenance Due  ?Topic Date Due  ? HIV Screening  Never done  ? Hepatitis C Screening  Never done  ? TETANUS/TDAP  Never done  ? ? ?There are no preventive care reminders to display for this patient. ? ?No results found for: TSH ?Lab Results  ?Component Value Date  ? WBC 9.4 11/03/2020  ? HGB 13.0 11/03/2020  ? HCT 39.2 11/03/2020  ? MCV 94.2 11/03/2020  ? PLT 343 11/03/2020  ? ?Lab Results  ?Component Value Date  ? NA 138 11/03/2020  ? K 4.9 11/03/2020  ? CO2 24 11/03/2020  ? GLUCOSE 95 11/03/2020  ? BUN 21 (H) 11/03/2020  ? CREATININE 1.23 (H) 11/03/2020  ? BILITOT 0.4 11/03/2020  ? ALKPHOS 44 11/03/2020  ? AST 16 11/03/2020  ? ALT 13 11/03/2020  ? PROT 7.7 11/03/2020  ? ALBUMIN 4.0 11/03/2020  ? CALCIUM 9.4 11/03/2020  ? ANIONGAP 10 11/03/2020  ? GFR 65.57 10/17/2020  ? ?Lab Results  ?Component Value Date  ? CHOL 225 (H) 10/17/2020  ? ?Lab Results  ?Component Value Date  ? HDL 44.30 10/17/2020  ? ?Lab Results  ?Component Value Date  ? LDLCALC 146 (H) 10/17/2020  ? ?Lab Results  ?Component Value Date  ? TRIG 175.0 (H) 10/17/2020  ? ?Lab Results  ?Component Value Date  ? CHOLHDL 5 10/17/2020  ? ?No results found for: HGBA1C ? ?  ?Assessment & Plan:  ? ?Problem List Items Addressed This Visit   ?None ?Visit Diagnoses   ? ? PMS (premenstrual syndrome)    -  Primary  ? Relevant Medications  ? ibuprofen (ADVIL) 800 MG tablet  ? Migraine without status migrainosus, not intractable, unspecified migraine type      ? Relevant Medications  ? butalbital-acetaminophen-caffeine (FIORICET) 50-325-40 MG tablet  ? ibuprofen (ADVIL) 800 MG tablet  ? Healthcare  maintenance      ? Relevant Orders  ? Urinalysis, Routine w reflex microscopic  ? Lipid panel  ? Hemoglobin A1c  ? Comprehensive metabolic panel  ? CBC  ? Post-viral cough syndrome      ? Relevant Medications  ? benzonatate (TESSALON) 200 MG capsule  ? ?  ? ? ?Meds ordered this encounter  ?  Medications  ? butalbital-acetaminophen-caffeine (FIORICET) 50-325-40 MG tablet  ?  Sig: Take 1 tablet by mouth every 6 (six) hours as needed for headache or migraine.  ?  Dispense:  30 tablet  ?  Refill:  0  ? ibuprofen (ADVIL) 800 MG tablet  ?  Sig: Take 1 tablet (800 mg total) by mouth every 8 (eight) hours as needed.  ?  Dispense:  30 tablet  ?  Refill:  0  ? benzonatate (TESSALON) 200 MG capsule  ?  Sig: Take 1 capsule (200 mg total) by mouth 2 (two) times daily as needed for cough.  ?  Dispense:  20 capsule  ?  Refill:  0  ? ? ?Follow-up: Return in about 1 year (around 01/08/2023), or if symptoms worsen or fail to improve.  ? ?Continue butalbital for now because she is rarely using it and seeking pregnancy.  It is indicated.  As needed ibuprofen.  Information given on health maintenance and disease prevention. ?Mliss SaxWilliam Alfred Nemesio Castrillon, MD ?

## 2022-01-08 LAB — URINALYSIS, ROUTINE W REFLEX MICROSCOPIC
Bilirubin Urine: NEGATIVE
Ketones, ur: NEGATIVE
Leukocytes,Ua: NEGATIVE
Nitrite: NEGATIVE
RBC / HPF: NONE SEEN (ref 0–?)
Specific Gravity, Urine: 1.025 (ref 1.000–1.030)
Total Protein, Urine: NEGATIVE
Urine Glucose: NEGATIVE
Urobilinogen, UA: 0.2 (ref 0.0–1.0)
pH: 6 (ref 5.0–8.0)

## 2022-01-09 ENCOUNTER — Encounter: Payer: Self-pay | Admitting: Family Medicine

## 2022-01-20 ENCOUNTER — Ambulatory Visit: Payer: BC Managed Care – PPO | Admitting: Obstetrics and Gynecology

## 2022-03-24 ENCOUNTER — Ambulatory Visit: Payer: BC Managed Care – PPO | Admitting: Obstetrics and Gynecology

## 2022-04-14 ENCOUNTER — Ambulatory Visit (INDEPENDENT_AMBULATORY_CARE_PROVIDER_SITE_OTHER): Payer: BC Managed Care – PPO | Admitting: Obstetrics and Gynecology

## 2022-04-14 ENCOUNTER — Encounter: Payer: Self-pay | Admitting: Obstetrics and Gynecology

## 2022-04-14 ENCOUNTER — Other Ambulatory Visit (HOSPITAL_COMMUNITY)
Admission: RE | Admit: 2022-04-14 | Discharge: 2022-04-14 | Disposition: A | Discharge status: Home / Self Care | Payer: BC Managed Care – PPO | Source: Ambulatory Visit | Attending: Obstetrics and Gynecology | Admitting: Obstetrics and Gynecology

## 2022-04-14 VITALS — BP 118/83 | HR 62 | Ht 63.0 in | Wt 182.8 lb

## 2022-04-14 DIAGNOSIS — R87811 Vaginal high risk human papillomavirus (HPV) DNA test positive: Secondary | ICD-10-CM | POA: Insufficient documentation

## 2022-04-14 DIAGNOSIS — Z01419 Encounter for gynecological examination (general) (routine) without abnormal findings: Secondary | ICD-10-CM | POA: Insufficient documentation

## 2022-04-14 NOTE — Progress Notes (Signed)
Patient presents for annual exam. She recently went through IVF in November and miscarried. Her plan is to try IVF again in 2 months. She would like to discuss with the provider the plan if she becomes pregnant, and what hospitals Dr. Jolayne Panther will be at. Denies vaginal itching, burning, pain, odor.

## 2022-04-14 NOTE — Progress Notes (Signed)
Subjective:     Shelby Ellis is a 41 y.o. female P0010 with BMI 32 who is here for a comprehensive physical exam. The patient reports no problems. Patient reports a monthly period. She had bilateral salpingectomy due to hydrosalpinx and is scheduled for embryo transfer in a few months. First embryo transfer resulted in a 10 week cornual pregnancy and patient was informed that she will need a cesarean section with her next pregnancy. Patient is without any complaints. She denies pain or abnormal discharge. She denies urinary incontinence  Past Medical History:  Diagnosis Date   Asthma    Endometritis    Past Surgical History:  Procedure Laterality Date   ectopic pregnacy  02/2014   laproscopic surgery Right    Fallopian tube was removed   Family History  Problem Relation Age of Onset   Asthma Mother    Hypertension Father    Heart block Father     Social History   Socioeconomic History   Marital status: Married    Spouse name: Not on file   Number of children: Not on file   Years of education: Not on file   Highest education level: Not on file  Occupational History   Not on file  Tobacco Use   Smoking status: Never   Smokeless tobacco: Never  Vaping Use   Vaping Use: Never used  Substance and Sexual Activity   Alcohol use: Not Currently   Drug use: Never   Sexual activity: Yes    Partners: Male  Other Topics Concern   Not on file  Social History Narrative   Not on file   Social Determinants of Health   Financial Resource Strain: Not on file  Food Insecurity: Not on file  Transportation Needs: Not on file  Physical Activity: Not on file  Stress: Not on file  Social Connections: Not on file  Intimate Partner Violence: Not on file   Health Maintenance  Topic Date Due   HIV Screening  Never done   Hepatitis C Screening  Never done   TETANUS/TDAP  Never done   PAP SMEAR-Modifier  Never done   INFLUENZA VACCINE  04/28/2022   MAMMOGRAM  11/28/2022   HPV VACCINES   Completed   COVID-19 Vaccine  Discontinued       Review of Systems Pertinent items noted in HPI and remainder of comprehensive ROS otherwise negative.   Objective:  Blood pressure 118/83, pulse 62, height 5\' 3"  (1.6 m), weight 182 lb 12.8 oz (82.9 kg).   GENERAL: Well-developed, well-nourished female in no acute distress.  HEENT: Normocephalic, atraumatic. Sclerae anicteric.  NECK: Supple. Normal thyroid.  LUNGS: Clear to auscultation bilaterally.  HEART: Regular rate and rhythm. BREASTS: Symmetric in size. No palpable masses or lymphadenopathy, skin changes, or nipple drainage. ABDOMEN: Soft, nontender, nondistended. No organomegaly. PELVIC: Normal external female genitalia. Vagina is pink and rugated.  Normal discharge. Normal appearing cervix. Uterus is normal in size. No adnexal mass or tenderness. Chaperone present during the pelvic exam EXTREMITIES: No cyanosis, clubbing, or edema, 2+ distal pulses.     Assessment:    Healthy female exam.      Plan:    Pap smear collected Patient declined STI testing Patient with normal mammogram 11/2021 Patient will be contacted with abnormal results Patient scheduled for embryo transfer in a few months. Continue prenatal vitamins See After Visit Summary for Counseling Recommendations

## 2022-04-14 NOTE — Addendum Note (Signed)
Addended by: Sharlyne Pacas on: 04/14/2022 01:58 PM   Modules accepted: Orders

## 2022-04-15 LAB — CERVICOVAGINAL ANCILLARY ONLY
Bacterial Vaginitis (gardnerella): NEGATIVE
Candida Glabrata: NEGATIVE
Candida Vaginitis: NEGATIVE
Chlamydia: NEGATIVE
Comment: NEGATIVE
Comment: NEGATIVE
Comment: NEGATIVE
Comment: NEGATIVE
Comment: NEGATIVE
Comment: NORMAL
Neisseria Gonorrhea: NEGATIVE
Trichomonas: NEGATIVE

## 2022-04-20 LAB — CYTOLOGY - PAP
Comment: NEGATIVE
Comment: NEGATIVE
Comment: NEGATIVE
Diagnosis: NEGATIVE
Diagnosis: REACTIVE
HPV 16: NEGATIVE
HPV 18 / 45: NEGATIVE
High risk HPV: POSITIVE — AB

## 2022-04-21 ENCOUNTER — Encounter: Payer: Self-pay | Admitting: Obstetrics and Gynecology

## 2022-05-06 DIAGNOSIS — F4323 Adjustment disorder with mixed anxiety and depressed mood: Secondary | ICD-10-CM | POA: Diagnosis not present

## 2022-05-11 ENCOUNTER — Encounter: Payer: Self-pay | Admitting: Family Medicine

## 2022-05-11 ENCOUNTER — Telehealth (INDEPENDENT_AMBULATORY_CARE_PROVIDER_SITE_OTHER): Payer: BC Managed Care – PPO | Admitting: Family Medicine

## 2022-05-11 VITALS — Ht 63.0 in | Wt 182.0 lb

## 2022-05-11 DIAGNOSIS — R350 Frequency of micturition: Secondary | ICD-10-CM | POA: Diagnosis not present

## 2022-05-11 NOTE — Progress Notes (Signed)
Established Patient Office Visit  Subjective   Patient ID: Shelby Ellis, female    DOB: May 18, 1981  Age: 41 y.o. MRN: [id number]  Chief Complaint  Patient presents with   Dysuria    Dysuria, urine frequency symptoms x 2 days. Patient did at home UTI test showed positive for UTI taking OTC medications only temporary relief when taking.     Dysuria  Associated symptoms include frequency and urgency. Pertinent negatives include no chills or hematuria.   reports a 2-day history of urinary frequency urgency with some discomfort with urination.  Denies fevers chills nausea or vomiting or flank pain.  She has been taking some baths with a new bath soap.  She is in a stable monogamous relationship.    Review of Systems  Constitutional:  Negative for chills, diaphoresis, malaise/fatigue and weight loss.  HENT: Negative.    Eyes: Negative.  Negative for blurred vision and double vision.  Cardiovascular:  Negative for chest pain.  Gastrointestinal:  Negative for abdominal pain.  Genitourinary:  Positive for dysuria, frequency and urgency. Negative for hematuria.  Musculoskeletal:  Negative for falls and myalgias.  Neurological:  Negative for speech change, loss of consciousness and weakness.  Psychiatric/Behavioral: Negative.        Objective:     Ht 5\' 3"  (1.6 m)   Wt 182 lb (82.6 kg)   BMI 32.24 kg/m    Physical Exam Constitutional:      General: She is not in acute distress. Pulmonary:     Effort: Pulmonary effort is normal.  Neurological:     Mental Status: She is alert and oriented to person, place, and time.     Motor: Weakness present.  Psychiatric:        Mood and Affect: Mood normal.        Behavior: Behavior normal.      No results found for any visits on 05/11/22.    The 10-year ASCVD risk score (Arnett DK, et al., 2019) is: 1.2%    Assessment & Plan:   Problem List Items Addressed This Visit   None Visit Diagnoses     Urinary frequency    -  Primary    Relevant Orders   Urinalysis, Routine w reflex microscopic   Urine Culture       No follow-ups on file.  We will bring a urine sample annually for UA and culture and sensitivity.  2020, MD  Virtual Visit via Telephone Note  I connected with Shelby Ellis on 05/11/22 at 11:00 AM EDT by telephone and verified that I am speaking with the correct person using two identifiers.  Location: Patient: home alone Provider: work   I discussed the limitations, risks, security and privacy concerns of performing an evaluation and management service by telephone and the availability of in person appointments. I also discussed with the patient that there may be a patient responsible charge related to this service. The patient expressed understanding and agreed to proceed.   History of Present Illness:    Observations/Objective:   Assessment and Plan:   Follow Up Instructions:    I discussed the assessment and treatment plan with the patient. The patient was provided an opportunity to ask questions and all were answered. The patient agreed with the plan and demonstrated an understanding of the instructions.   The patient was advised to call back or seek an in-person evaluation if the symptoms worsen or if the condition fails to improve as anticipated.  I  provided 15 minutes of non-face-to-face time during this encounter.   Shelby Sax, MD   Interactive video and audio telecommunications were attempted between myself and the patient. However they failed due to the patient having technical difficulties or not having access to video capability. We continued and completed with audio only.

## 2022-05-12 ENCOUNTER — Encounter: Payer: Self-pay | Admitting: Family Medicine

## 2022-05-12 LAB — URINALYSIS, ROUTINE W REFLEX MICROSCOPIC
Bilirubin Urine: NEGATIVE
Ketones, ur: NEGATIVE
Leukocytes,Ua: NEGATIVE
Nitrite: NEGATIVE
Specific Gravity, Urine: 1.01 (ref 1.000–1.030)
Total Protein, Urine: NEGATIVE
Urine Glucose: NEGATIVE
Urobilinogen, UA: 0.2 (ref 0.0–1.0)
pH: 6.5 (ref 5.0–8.0)

## 2022-05-12 LAB — URINE CULTURE
MICRO NUMBER:: 13775185
SPECIMEN QUALITY:: ADEQUATE

## 2022-05-13 DIAGNOSIS — F4323 Adjustment disorder with mixed anxiety and depressed mood: Secondary | ICD-10-CM | POA: Diagnosis not present

## 2022-05-14 ENCOUNTER — Other Ambulatory Visit: Payer: Self-pay

## 2022-05-14 DIAGNOSIS — R350 Frequency of micturition: Secondary | ICD-10-CM

## 2022-05-20 DIAGNOSIS — F4323 Adjustment disorder with mixed anxiety and depressed mood: Secondary | ICD-10-CM | POA: Diagnosis not present

## 2022-06-03 DIAGNOSIS — N979 Female infertility, unspecified: Secondary | ICD-10-CM | POA: Diagnosis not present

## 2022-06-03 DIAGNOSIS — Z3141 Encounter for fertility testing: Secondary | ICD-10-CM | POA: Diagnosis not present

## 2022-06-04 DIAGNOSIS — F4323 Adjustment disorder with mixed anxiety and depressed mood: Secondary | ICD-10-CM | POA: Diagnosis not present

## 2022-06-17 DIAGNOSIS — F4323 Adjustment disorder with mixed anxiety and depressed mood: Secondary | ICD-10-CM | POA: Diagnosis not present

## 2022-06-23 DIAGNOSIS — F4323 Adjustment disorder with mixed anxiety and depressed mood: Secondary | ICD-10-CM | POA: Diagnosis not present

## 2022-06-24 DIAGNOSIS — F4323 Adjustment disorder with mixed anxiety and depressed mood: Secondary | ICD-10-CM | POA: Diagnosis not present

## 2022-06-26 DIAGNOSIS — N711 Chronic inflammatory disease of uterus: Secondary | ICD-10-CM | POA: Diagnosis not present

## 2022-07-02 DIAGNOSIS — F4323 Adjustment disorder with mixed anxiety and depressed mood: Secondary | ICD-10-CM | POA: Diagnosis not present

## 2022-07-23 DIAGNOSIS — Z319 Encounter for procreative management, unspecified: Secondary | ICD-10-CM | POA: Diagnosis not present

## 2022-07-24 DIAGNOSIS — Z3183 Encounter for assisted reproductive fertility procedure cycle: Secondary | ICD-10-CM | POA: Diagnosis not present

## 2022-07-28 DIAGNOSIS — Z3141 Encounter for fertility testing: Secondary | ICD-10-CM | POA: Diagnosis not present

## 2022-08-03 DIAGNOSIS — Z3183 Encounter for assisted reproductive fertility procedure cycle: Secondary | ICD-10-CM | POA: Diagnosis not present

## 2022-08-11 DIAGNOSIS — Z3200 Encounter for pregnancy test, result unknown: Secondary | ICD-10-CM | POA: Diagnosis not present

## 2022-08-12 DIAGNOSIS — Z3200 Encounter for pregnancy test, result unknown: Secondary | ICD-10-CM | POA: Diagnosis not present

## 2022-08-27 DIAGNOSIS — Z3200 Encounter for pregnancy test, result unknown: Secondary | ICD-10-CM | POA: Diagnosis not present

## 2022-08-27 LAB — OB RESULTS CONSOLE HIV ANTIBODY (ROUTINE TESTING): HIV: NONREACTIVE

## 2022-08-27 LAB — OB RESULTS CONSOLE RUBELLA ANTIBODY, IGM: Rubella: IMMUNE

## 2022-08-27 LAB — OB RESULTS CONSOLE HEPATITIS B SURFACE ANTIGEN: Hepatitis B Surface Ag: NEGATIVE

## 2022-08-27 LAB — OB RESULTS CONSOLE ABO/RH: RH Type: POSITIVE

## 2022-08-27 LAB — HEPATITIS C ANTIBODY: HCV Ab: NEGATIVE

## 2022-08-27 LAB — OB RESULTS CONSOLE RPR: RPR: NONREACTIVE

## 2022-08-27 LAB — OB RESULTS CONSOLE ANTIBODY SCREEN: Antibody Screen: NEGATIVE

## 2022-09-10 DIAGNOSIS — O0900 Supervision of pregnancy with history of infertility, unspecified trimester: Secondary | ICD-10-CM | POA: Diagnosis not present

## 2022-09-18 DIAGNOSIS — O09521 Supervision of elderly multigravida, first trimester: Secondary | ICD-10-CM | POA: Diagnosis not present

## 2022-09-23 DIAGNOSIS — O09811 Supervision of pregnancy resulting from assisted reproductive technology, first trimester: Secondary | ICD-10-CM | POA: Diagnosis not present

## 2022-09-23 DIAGNOSIS — O09521 Supervision of elderly multigravida, first trimester: Secondary | ICD-10-CM | POA: Diagnosis not present

## 2022-10-01 DIAGNOSIS — F4323 Adjustment disorder with mixed anxiety and depressed mood: Secondary | ICD-10-CM | POA: Diagnosis not present

## 2022-10-12 DIAGNOSIS — Z3689 Encounter for other specified antenatal screening: Secondary | ICD-10-CM | POA: Diagnosis not present

## 2022-10-12 DIAGNOSIS — Z113 Encounter for screening for infections with a predominantly sexual mode of transmission: Secondary | ICD-10-CM | POA: Diagnosis not present

## 2022-10-12 DIAGNOSIS — Z363 Encounter for antenatal screening for malformations: Secondary | ICD-10-CM | POA: Diagnosis not present

## 2022-10-12 DIAGNOSIS — O09811 Supervision of pregnancy resulting from assisted reproductive technology, first trimester: Secondary | ICD-10-CM | POA: Diagnosis not present

## 2022-10-12 LAB — OB RESULTS CONSOLE GC/CHLAMYDIA
Chlamydia: NEGATIVE
Neisseria Gonorrhea: NEGATIVE

## 2022-10-15 DIAGNOSIS — F4323 Adjustment disorder with mixed anxiety and depressed mood: Secondary | ICD-10-CM | POA: Diagnosis not present

## 2022-11-05 ENCOUNTER — Encounter: Payer: Self-pay | Admitting: Family Medicine

## 2022-11-19 ENCOUNTER — Inpatient Hospital Stay (HOSPITAL_BASED_OUTPATIENT_CLINIC_OR_DEPARTMENT_OTHER): Payer: BC Managed Care – PPO

## 2022-11-19 ENCOUNTER — Encounter (HOSPITAL_COMMUNITY): Payer: Self-pay | Admitting: *Deleted

## 2022-11-19 ENCOUNTER — Inpatient Hospital Stay (HOSPITAL_COMMUNITY)
Admission: AD | Admit: 2022-11-19 | Discharge: 2022-11-19 | Disposition: A | Discharge status: Home / Self Care | Payer: BC Managed Care – PPO | Attending: Obstetrics and Gynecology | Admitting: Obstetrics and Gynecology

## 2022-11-19 DIAGNOSIS — R102 Pelvic and perineal pain: Secondary | ICD-10-CM | POA: Diagnosis not present

## 2022-11-19 DIAGNOSIS — Z3A18 18 weeks gestation of pregnancy: Secondary | ICD-10-CM | POA: Diagnosis not present

## 2022-11-19 DIAGNOSIS — R109 Unspecified abdominal pain: Secondary | ICD-10-CM | POA: Diagnosis not present

## 2022-11-19 DIAGNOSIS — O99012 Anemia complicating pregnancy, second trimester: Secondary | ICD-10-CM

## 2022-11-19 DIAGNOSIS — D649 Anemia, unspecified: Secondary | ICD-10-CM | POA: Insufficient documentation

## 2022-11-19 DIAGNOSIS — I959 Hypotension, unspecified: Secondary | ICD-10-CM | POA: Diagnosis not present

## 2022-11-19 DIAGNOSIS — O26899 Other specified pregnancy related conditions, unspecified trimester: Secondary | ICD-10-CM

## 2022-11-19 DIAGNOSIS — O09522 Supervision of elderly multigravida, second trimester: Secondary | ICD-10-CM | POA: Diagnosis not present

## 2022-11-19 DIAGNOSIS — O26892 Other specified pregnancy related conditions, second trimester: Secondary | ICD-10-CM

## 2022-11-19 DIAGNOSIS — R0902 Hypoxemia: Secondary | ICD-10-CM | POA: Diagnosis not present

## 2022-11-19 LAB — CBC
HCT: 32.3 % — ABNORMAL LOW (ref 36.0–46.0)
Hemoglobin: 10.5 g/dL — ABNORMAL LOW (ref 12.0–15.0)
MCH: 30.3 pg (ref 26.0–34.0)
MCHC: 32.5 g/dL (ref 30.0–36.0)
MCV: 93.4 fL (ref 80.0–100.0)
Platelets: 359 10*3/uL (ref 150–400)
RBC: 3.46 MIL/uL — ABNORMAL LOW (ref 3.87–5.11)
RDW: 13.3 % (ref 11.5–15.5)
WBC: 10.9 10*3/uL — ABNORMAL HIGH (ref 4.0–10.5)
nRBC: 0 % (ref 0.0–0.2)

## 2022-11-19 LAB — WET PREP, GENITAL
Clue Cells Wet Prep HPF POC: NONE SEEN
Sperm: NONE SEEN
Trich, Wet Prep: NONE SEEN
WBC, Wet Prep HPF POC: <10 (ref <10)
Yeast Wet Prep HPF POC: NONE SEEN

## 2022-11-19 LAB — URINALYSIS, ROUTINE W REFLEX MICROSCOPIC
Bilirubin Urine: NEGATIVE
Glucose, UA: NEGATIVE mg/dL
Hgb urine dipstick: NEGATIVE
Ketones, ur: NEGATIVE mg/dL
Leukocytes,Ua: NEGATIVE
Nitrite: NEGATIVE
Protein, ur: NEGATIVE mg/dL
Specific Gravity, Urine: 1.002 — ABNORMAL LOW (ref 1.005–1.030)
pH: 7 (ref 5.0–8.0)

## 2022-11-19 NOTE — MAU Provider Note (Addendum)
History     CSN: [id number]  Arrival date and time: 11/19/22 [id number]   Event Date/Time   First Provider Initiated Contact with Patient 11/19/22 1925      Chief Complaint  Patient presents with   Abdominal Pain   42 y.o. G3P0020 @18$ .1 wks presenting with abdominal pain. Reports onset last night after a verbal altercation with her husband. Describes as stretching pain in her upper and lower abdomen. She was able to rest last night then pain returned again today after another verbal altercation with her husband. Pain was worse earlier, now rating 3/10. Has not treated it. She denies any physical violence. Denies VB, discharge, or LOF. Her pregnancy is complicated by hx of ectopic x2, IVF, and AMA.    OB History     Gravida  3   Para      Term      Preterm      AB  2   Living         SAB      IAB      Ectopic  2   Multiple      Live Births              Past Medical History:  Diagnosis Date   Asthma    Endometritis     Past Surgical History:  Procedure Laterality Date   BILATERAL SALPINGECTOMY     ectopic pregnacy  02/2014   laproscopic surgery Right    Fallopian tube was removed    Family History  Problem Relation Age of Onset   Asthma Mother    Hypertension Father    Heart block Father     Social History   Tobacco Use   Smoking status: Never   Smokeless tobacco: Never  Vaping Use   Vaping Use: Never used  Substance Use Topics   Alcohol use: Not Currently   Drug use: Never    Allergies:  Allergies  Allergen Reactions   Shellfish Allergy Other (See Comments)   Shrimp (Diagnostic) Swelling    Medications Prior to Admission  Medication Sig Dispense Refill Last Dose   aspirin 81 MG chewable tablet Chew 81 mg by mouth daily.      Omega-3 Fatty Acids (OMEGA 3 PO) Take by mouth.   11/19/2022   Prenatal MV-Min-Fe Fum-FA-DHA (PRENATAL 1 PO) Take by mouth.   11/19/2022   EPINEPHrine 0.3 mg/0.3 mL IJ SOAJ injection Inject 0.3 mg into the  muscle as needed for anaphylaxis. 1 each 2    Fluticasone-Salmeterol (ADVAIR) 100-50 MCG/DOSE AEPB Inhale 1 puff into the lungs 2 (two) times daily. 60 each 3 More than a month   Multiple Vitamin (MULTIVITAMIN) capsule Take 1 capsule by mouth daily.       Review of Systems  Constitutional:  Negative for chills and fever.  Gastrointestinal:  Positive for abdominal pain.  Genitourinary:  Negative for dysuria, hematuria, vaginal bleeding and vaginal discharge.   Physical Exam   Blood pressure 120/76, pulse 88, temperature 97.8 F (36.6 C), resp. rate 20.  Physical Exam Vitals and nursing note reviewed. Exam conducted with a chaperone present.  Constitutional:      General: She is not in acute distress.    Appearance: Normal appearance.  HENT:     Head: Normocephalic and atraumatic.  Cardiovascular:     Rate and Rhythm: Normal rate.  Pulmonary:     Effort: Pulmonary effort is normal. No respiratory distress.  Abdominal:     General: There  is no distension.     Palpations: There is no mass.     Tenderness: There is no abdominal tenderness. There is no guarding or rebound.     Hernia: No hernia is present.  Musculoskeletal:        General: Normal range of motion.     Cervical back: Normal range of motion.  Skin:    General: Skin is warm and dry.  Neurological:     General: No focal deficit present.     Mental Status: She is alert and oriented to person, place, and time.  Psychiatric:        Mood and Affect: Mood normal.        Behavior: Behavior normal.    Results for orders placed or performed during the hospital encounter of 11/19/22 (from the past 24 hour(s))  Urinalysis, Routine w reflex microscopic -Urine, Clean Catch     Status: Abnormal   Collection Time: 11/19/22  7:25 PM  Result Value Ref Range   Color, Urine COLORLESS (A) YELLOW   APPearance CLEAR CLEAR   Specific Gravity, Urine 1.002 (L) 1.005 - 1.030   pH 7.0 5.0 - 8.0   Glucose, UA NEGATIVE NEGATIVE mg/dL    Hgb urine dipstick NEGATIVE NEGATIVE   Bilirubin Urine NEGATIVE NEGATIVE   Ketones, ur NEGATIVE NEGATIVE mg/dL   Protein, ur NEGATIVE NEGATIVE mg/dL   Nitrite NEGATIVE NEGATIVE   Leukocytes,Ua NEGATIVE NEGATIVE  Wet prep, genital     Status: None   Collection Time: 11/19/22  7:36 PM   Specimen: PATH Cytology Cervicovaginal Ancillary Only  Result Value Ref Range   Yeast Wet Prep HPF POC NONE SEEN NONE SEEN   Trich, Wet Prep NONE SEEN NONE SEEN   Clue Cells Wet Prep HPF POC NONE SEEN NONE SEEN   WBC, Wet Prep HPF POC <10 <10   Sperm NONE SEEN   CBC     Status: Abnormal   Collection Time: 11/19/22  9:16 PM  Result Value Ref Range   WBC 10.9 (H) 4.0 - 10.5 K/uL   RBC 3.46 (L) 3.87 - 5.11 MIL/uL   Hemoglobin 10.5 (L) 12.0 - 15.0 g/dL   HCT 32.3 (L) 36.0 - 46.0 %   MCV 93.4 80.0 - 100.0 fL   MCH 30.3 26.0 - 34.0 pg   MCHC 32.5 30.0 - 36.0 g/dL   RDW 13.3 11.5 - 15.5 %   Platelets 359 150 - 400 K/uL   nRBC 0.0 0.0 - 0.2 %    Media Information  Document Information   MAU Course  Procedures  MDM Labs and imaging ordered and reviewed. No signs of acute process. Declines analgesic. Normal [place]. Pt resting and states feeling better. Stable for discharge home.  Assessment and Plan   1. [redacted] weeks gestation of pregnancy   2. Abdominal pain affecting pregnancy   3. Anemia affecting pregnancy in second trimester    Discharge home [facility] as scheduled SAB precautions  Allergies as of 11/19/2022       Reactions   Shellfish Allergy Other (See Comments)   Shrimp (diagnostic) Swelling        Medication List     TAKE these medications    aspirin 81 MG chewable tablet Chew 81 mg by mouth daily.   EPINEPHrine 0.3 mg/0.3 mL Soaj injection Commonly known as: EPI-PEN Inject 0.3 mg into the muscle as needed for anaphylaxis.   Fluticasone-Salmeterol 100-50 MCG/DOSE Aepb Commonly known as: ADVAIR Inhale 1 puff  into the lungs 2 (two) times daily.    multivitamin capsule Take 1 capsule by mouth daily.   OMEGA 3 PO Take by mouth.   PRENATAL 1 PO Take by mouth.         Julianne Handler, CNM 11/19/2022, 9:58 PM

## 2022-11-19 NOTE — MAU Note (Signed)
.  Shelby Ellis is a 42 y.o. at Unknown here in MAU reporting:EMS arriaval about 8pm last night she had a verbal altercation with her husband. Stared having abd pian that was sharp and increased pelvic pressure. Pain did not go away today .Was 8 now more like 3 now. pain gets worse with movement . Denies any vag bleeding or leaking. Reports good fetal movement throughout  the day. LMP:  Onset of complaint: 8pm last night Pain score: 3 Vitals:   11/19/22 1912  BP: 120/76  Pulse: 88  Resp: 20  Temp: 97.8 F (36.6 C)     FHT:158 Lab orders placed from triage:  u/a

## 2022-11-20 LAB — GC/CHLAMYDIA PROBE AMP (~~LOC~~) NOT AT ARMC
Chlamydia: NEGATIVE
Comment: NEGATIVE
Comment: NORMAL
Neisseria Gonorrhea: NEGATIVE

## 2022-11-25 DIAGNOSIS — Z3A19 19 weeks gestation of pregnancy: Secondary | ICD-10-CM | POA: Diagnosis not present

## 2022-11-25 DIAGNOSIS — Z363 Encounter for antenatal screening for malformations: Secondary | ICD-10-CM | POA: Diagnosis not present

## 2022-12-24 DIAGNOSIS — O09812 Supervision of pregnancy resulting from assisted reproductive technology, second trimester: Secondary | ICD-10-CM | POA: Diagnosis not present

## 2022-12-24 DIAGNOSIS — O358XX0 Maternal care for other (suspected) fetal abnormality and damage, not applicable or unspecified: Secondary | ICD-10-CM | POA: Diagnosis not present

## 2023-01-13 IMAGING — MG MM DIGITAL SCREENING BILAT W/ TOMO AND CAD
8 series · 8 of 24 positions shown · non-contrast
Comparison: Previous exam(s).

CLINICAL DATA: Screening.

EXAM:
DIGITAL SCREENING BILATERAL MAMMOGRAM WITH TOMOSYNTHESIS AND CAD
TECHNIQUE: Bilateral screening digital craniocaudal and mediolateral oblique
mammograms were obtained. Bilateral screening digital breast
tomosynthesis was performed. The images were evaluated with
computer-aided detection.

[L MLO synth-2D]
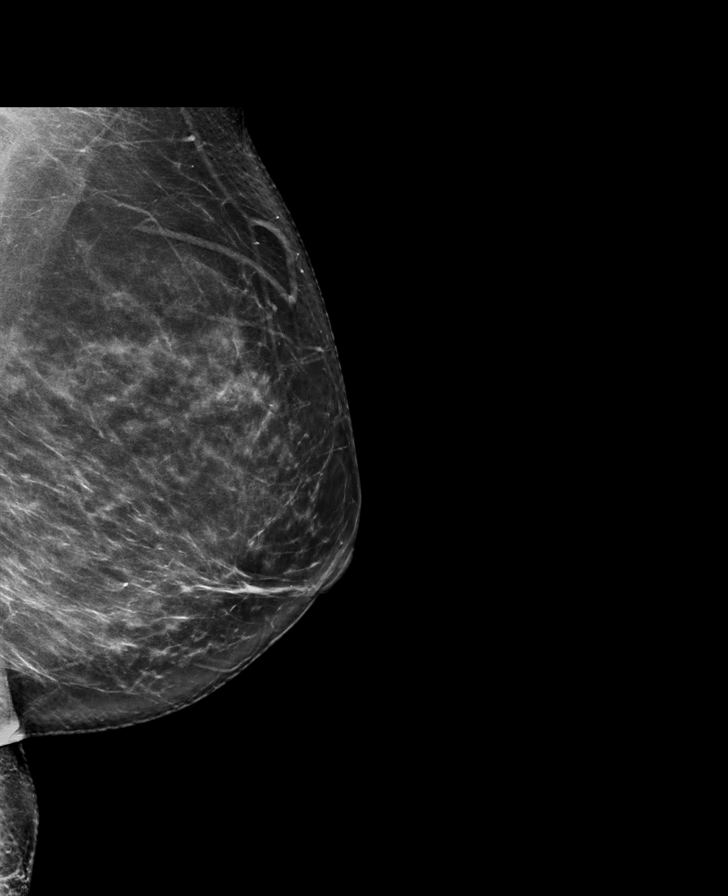

[L CC synth-2D]
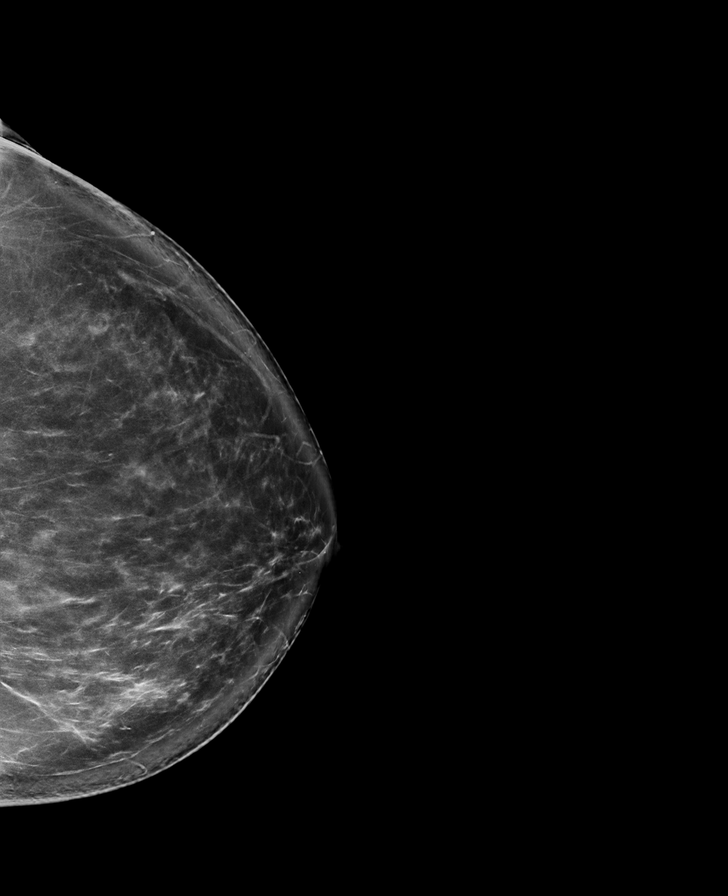

[R MLO synth-2D]
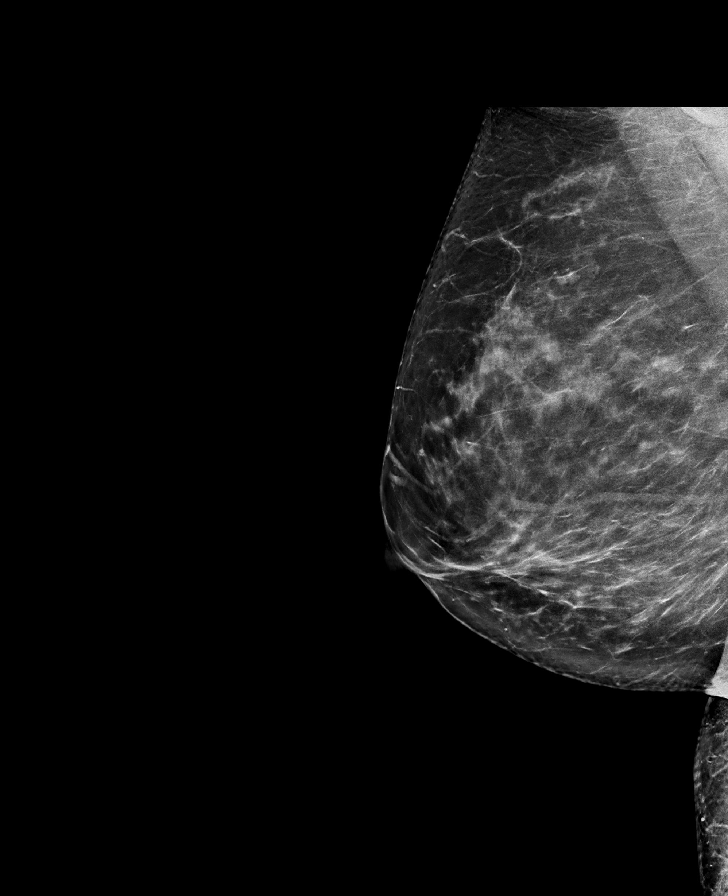

[R CC synth-2D]
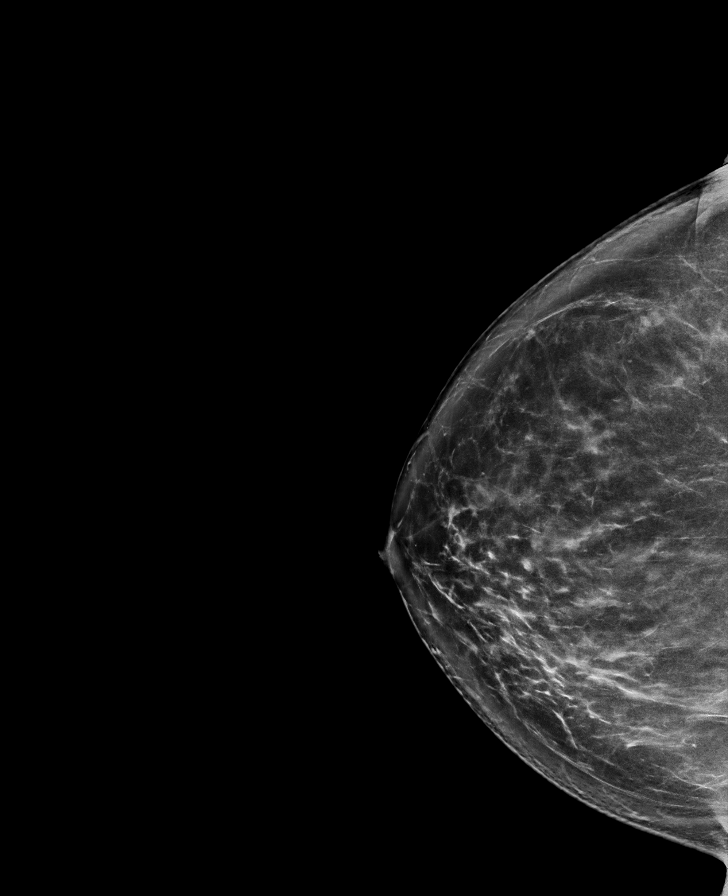

[L MLO tomo · tomo slice 44/87.0]
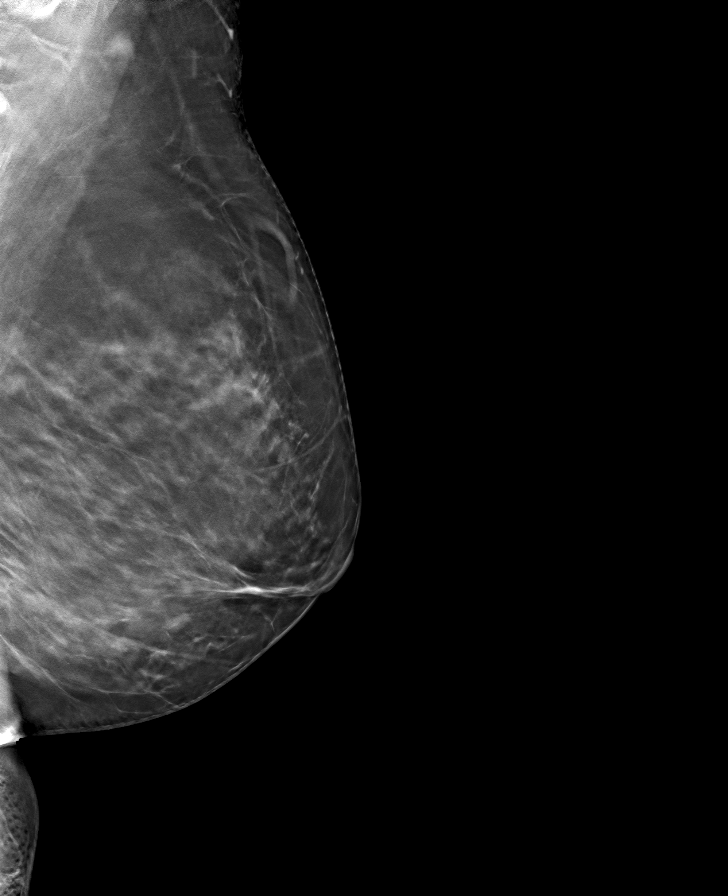

[L CC tomo · tomo slice 47/92.0]
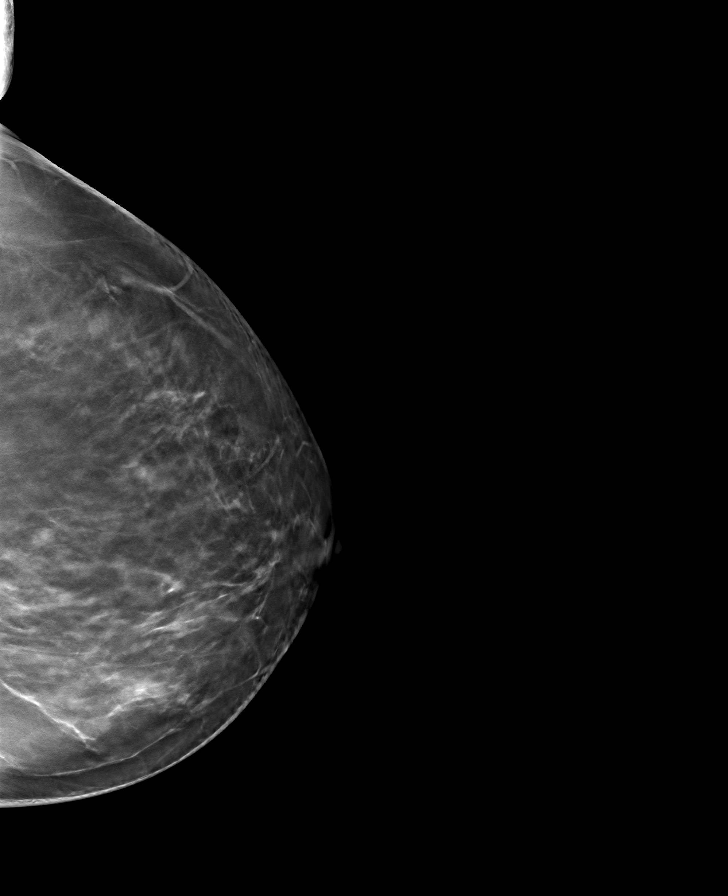

[R MLO tomo · tomo slice 43/85.0]
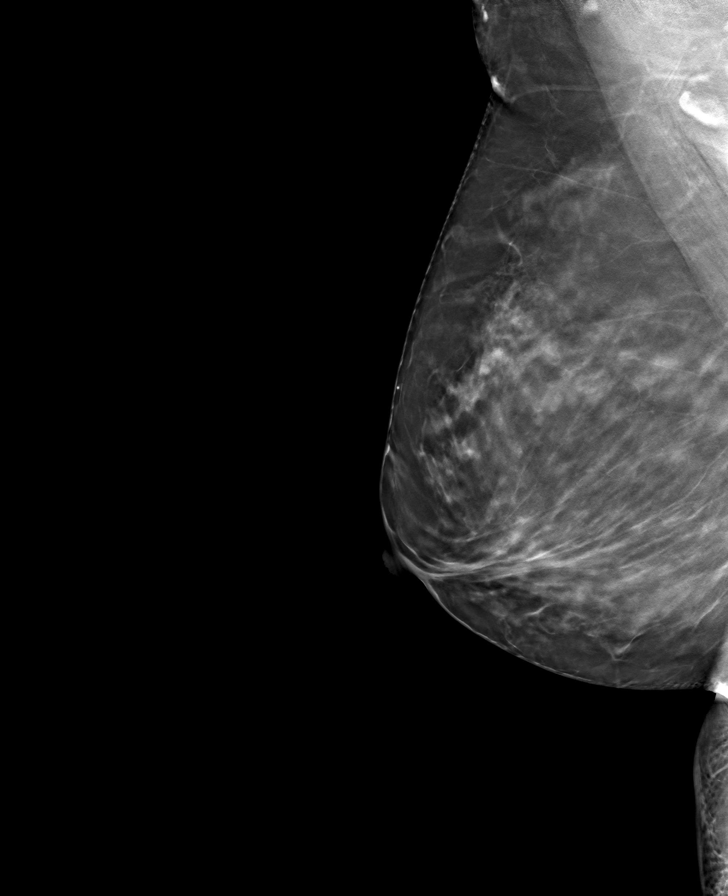

[R CC tomo · tomo slice 44/87.0]
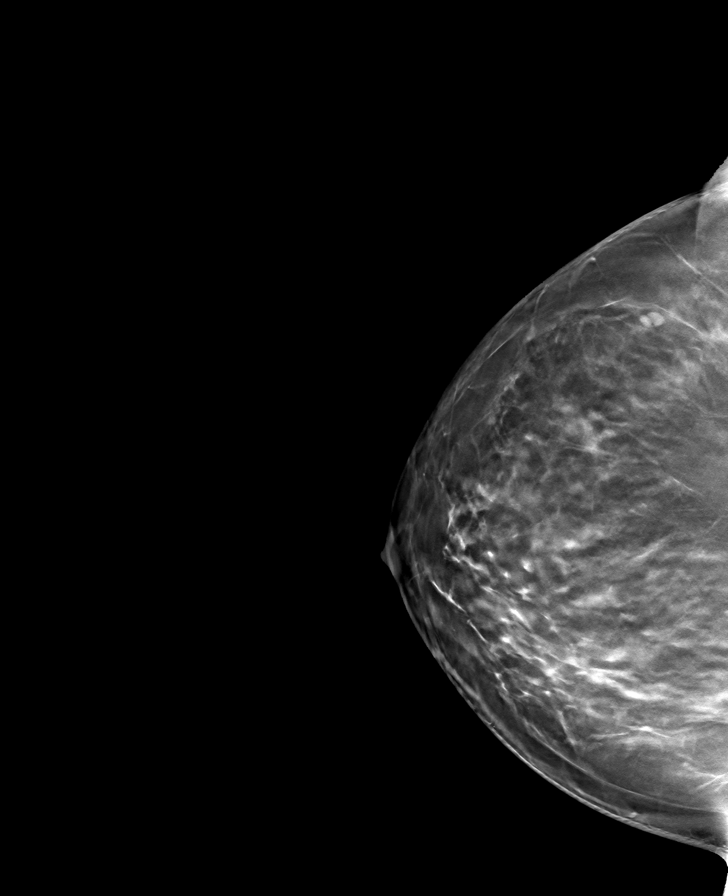

[8 of 24 positions shown; findings below may reference images not displayed]

ACR Breast Density Category c: The breast tissue is heterogeneously
dense, which may obscure small masses.
FINDINGS: There are no findings suspicious for malignancy.
IMPRESSION: No mammographic evidence of malignancy. A result letter of this
screening mammogram will be mailed directly to the patient.

RECOMMENDATION:
Screening mammogram in one year. (Code:Q3-W-BC3)

BI-RADS CATEGORY  1: Negative.

## 2023-01-25 DIAGNOSIS — Z3689 Encounter for other specified antenatal screening: Secondary | ICD-10-CM | POA: Diagnosis not present

## 2023-02-25 DIAGNOSIS — R319 Hematuria, unspecified: Secondary | ICD-10-CM | POA: Diagnosis not present

## 2023-03-08 DIAGNOSIS — Z23 Encounter for immunization: Secondary | ICD-10-CM | POA: Diagnosis not present

## 2023-03-22 ENCOUNTER — Encounter (HOSPITAL_COMMUNITY): Payer: Self-pay

## 2023-03-22 ENCOUNTER — Telehealth (HOSPITAL_COMMUNITY): Payer: Self-pay | Admitting: *Deleted

## 2023-03-22 NOTE — Patient Instructions (Signed)
Shelby Ellis  03/22/2023   Your procedure is scheduled on:  04/02/2023  Arrive at 1030 at Entrance C on [employer] at [facility]  and [employer]. You are invited to use the FREE valet parking or use the Visitor's parking deck.  Pick up the phone at the desk and dial [phone].  Call this number if you have problems the morning of surgery: [phone]  Remember:   Do not eat food:(After Midnight) Desps de medianoche.  Do not drink clear liquids: (After Midnight) Desps de medianoche.  Take these medicines the morning of surgery with A SIP OF WATER:  none   Do not wear jewelry, make-up or nail polish.  Do not wear lotions, powders, or perfumes. Do not wear deodorant.  Do not shave 48 hours prior to surgery.  Do not bring valuables to the hospital.  [facility] is not   responsible for any belongings or valuables brought to the hospital.  Contacts, dentures or bridgework may not be worn into surgery.  Leave suitcase in the car. After surgery it may be brought to your room.  For patients admitted to the hospital, checkout time is 11:00 AM the day of              discharge.      Please read over the following fact sheets that you were given:     Preparing for Surgery

## 2023-03-22 NOTE — Telephone Encounter (Signed)
Preadmission screen  

## 2023-03-23 ENCOUNTER — Telehealth (HOSPITAL_COMMUNITY): Payer: Self-pay | Admitting: *Deleted

## 2023-03-23 DIAGNOSIS — O09513 Supervision of elderly primigravida, third trimester: Secondary | ICD-10-CM | POA: Diagnosis not present

## 2023-03-23 DIAGNOSIS — Z3A35 35 weeks gestation of pregnancy: Secondary | ICD-10-CM | POA: Diagnosis not present

## 2023-03-23 NOTE — Telephone Encounter (Signed)
Preadmission screen  

## 2023-03-24 ENCOUNTER — Telehealth (HOSPITAL_COMMUNITY): Payer: Self-pay | Admitting: *Deleted

## 2023-03-24 NOTE — Telephone Encounter (Signed)
Preadmission screen  

## 2023-03-25 ENCOUNTER — Encounter (HOSPITAL_COMMUNITY): Payer: Self-pay

## 2023-03-25 ENCOUNTER — Telehealth (HOSPITAL_COMMUNITY): Payer: Self-pay | Admitting: *Deleted

## 2023-03-25 NOTE — Telephone Encounter (Signed)
Preadmission screen  

## 2023-03-29 DIAGNOSIS — O09513 Supervision of elderly primigravida, third trimester: Secondary | ICD-10-CM | POA: Diagnosis not present

## 2023-03-29 DIAGNOSIS — Z3685 Encounter for antenatal screening for Streptococcus B: Secondary | ICD-10-CM | POA: Diagnosis not present

## 2023-03-29 DIAGNOSIS — Z3A36 36 weeks gestation of pregnancy: Secondary | ICD-10-CM | POA: Diagnosis not present

## 2023-03-31 ENCOUNTER — Other Ambulatory Visit (HOSPITAL_COMMUNITY)
Admission: RE | Admit: 2023-03-31 | Discharge: 2023-03-31 | Disposition: A | Discharge status: Home / Self Care | Payer: BC Managed Care – PPO | Source: Ambulatory Visit | Attending: Obstetrics and Gynecology | Admitting: Obstetrics and Gynecology

## 2023-03-31 DIAGNOSIS — J45909 Unspecified asthma, uncomplicated: Secondary | ICD-10-CM | POA: Diagnosis not present

## 2023-03-31 DIAGNOSIS — O26893 Other specified pregnancy related conditions, third trimester: Secondary | ICD-10-CM | POA: Diagnosis not present

## 2023-03-31 DIAGNOSIS — Z3A37 37 weeks gestation of pregnancy: Secondary | ICD-10-CM | POA: Diagnosis not present

## 2023-03-31 DIAGNOSIS — Z98891 History of uterine scar from previous surgery: Secondary | ICD-10-CM | POA: Diagnosis not present

## 2023-03-31 DIAGNOSIS — O99214 Obesity complicating childbirth: Secondary | ICD-10-CM | POA: Diagnosis not present

## 2023-03-31 DIAGNOSIS — Z01812 Encounter for preprocedural laboratory examination: Secondary | ICD-10-CM | POA: Diagnosis not present

## 2023-03-31 DIAGNOSIS — Z8759 Personal history of other complications of pregnancy, childbirth and the puerperium: Secondary | ICD-10-CM

## 2023-03-31 DIAGNOSIS — O99513 Diseases of the respiratory system complicating pregnancy, third trimester: Secondary | ICD-10-CM | POA: Diagnosis not present

## 2023-03-31 HISTORY — DX: Other specified pregnancy related conditions, unspecified trimester: O26.899

## 2023-03-31 HISTORY — DX: Other complications of anesthesia, initial encounter: T88.59XA

## 2023-03-31 LAB — CBC
HCT: 37.6 % (ref 36.0–46.0)
Hemoglobin: 12.2 g/dL (ref 12.0–15.0)
MCH: 32 pg (ref 26.0–34.0)
MCHC: 32.4 g/dL (ref 30.0–36.0)
MCV: 98.7 fL (ref 80.0–100.0)
Platelets: 294 10*3/uL (ref 150–400)
RBC: 3.81 MIL/uL — ABNORMAL LOW (ref 3.87–5.11)
RDW: 15.5 % (ref 11.5–15.5)
WBC: 8.4 10*3/uL (ref 4.0–10.5)
nRBC: 0 % (ref 0.0–0.2)

## 2023-03-31 LAB — TYPE AND SCREEN
ABO/RH(D): A POS
Antibody Screen: NEGATIVE

## 2023-03-31 LAB — RPR: RPR Ser Ql: NONREACTIVE

## 2023-04-01 NOTE — Anesthesia Preprocedure Evaluation (Addendum)
Anesthesia Evaluation  Patient identified by MRN, date of birth, ID band Patient awake    Reviewed: Allergy & Precautions, NPO status , Patient's Chart, lab work & pertinent test results  Airway Mallampati: II  TM Distance: >3 FB Neck ROM: Full    Dental no notable dental hx. (+) Teeth Intact, Dental Advisory Given   Pulmonary asthma    Pulmonary exam normal breath sounds clear to auscultation       Cardiovascular negative cardio ROS Normal cardiovascular exam Rhythm:Regular Rate:Normal     Neuro/Psych  Neuromuscular disease    GI/Hepatic negative GI ROS, Neg liver ROS,,,  Endo/Other  negative endocrine ROS    Renal/GU negative Renal ROS     Musculoskeletal   Abdominal   Peds  Hematology negative hematology ROS (+) Lab Results      Component                Value               Date                      WBC                      8.4                 03/31/2023                HGB                      12.2                03/31/2023                HCT                      37.6                03/31/2023                MCV                      98.7                03/31/2023                PLT                      294                 03/31/2023      T&S Available          Anesthesia Other Findings NKDA  Reproductive/Obstetrics (+) Pregnancy Hx of 2 ectopic pregnacies                             Anesthesia Physical Anesthesia Plan  ASA: 3  Anesthesia Plan: Spinal   Post-op Pain Management: Regional block*, Minimal or no pain anticipated and Ofirmev IV (intra-op)*   Induction:   PONV Risk Score and Plan: Treatment may vary due to age or medical condition, Ondansetron and Dexamethasone  Airway Management Planned: Natural Airway and Nasal Cannula  Additional Equipment: None  Intra-op Plan:   Post-operative Plan:   Informed Consent: I have reviewed the patients History and Physical,  chart, labs and discussed the procedure including the  risks, benefits and alternatives for the proposed anesthesia with the patient or authorized representative who has indicated his/her understanding and acceptance.     Dental advisory given  Plan Discussed with:   Anesthesia Plan Comments: (37.2 wk G3P0 for Primary C/S)        Anesthesia Quick Evaluation

## 2023-04-02 ENCOUNTER — Inpatient Hospital Stay (HOSPITAL_COMMUNITY): Payer: BC Managed Care – PPO | Admitting: Obstetrics and Gynecology

## 2023-04-02 ENCOUNTER — Other Ambulatory Visit: Payer: Self-pay

## 2023-04-02 ENCOUNTER — Encounter (HOSPITAL_COMMUNITY)
Admission: RE | Disposition: A | Discharge status: Home / Self Care | Payer: Self-pay | Source: Home / Self Care | Attending: Obstetrics and Gynecology

## 2023-04-02 ENCOUNTER — Inpatient Hospital Stay (HOSPITAL_COMMUNITY)
Admission: RE | Admit: 2023-04-02 | Discharge: 2023-04-04 | DRG: 788 | Disposition: A | Discharge status: Home / Self Care | Payer: BC Managed Care – PPO | Attending: Obstetrics and Gynecology | Admitting: Obstetrics and Gynecology

## 2023-04-02 ENCOUNTER — Encounter (HOSPITAL_COMMUNITY): Payer: Self-pay | Admitting: Obstetrics and Gynecology

## 2023-04-02 DIAGNOSIS — Z8759 Personal history of other complications of pregnancy, childbirth and the puerperium: Secondary | ICD-10-CM

## 2023-04-02 DIAGNOSIS — Z98891 History of uterine scar from previous surgery: Secondary | ICD-10-CM

## 2023-04-02 DIAGNOSIS — Z01812 Encounter for preprocedural laboratory examination: Secondary | ICD-10-CM | POA: Diagnosis not present

## 2023-04-02 DIAGNOSIS — O26893 Other specified pregnancy related conditions, third trimester: Secondary | ICD-10-CM | POA: Diagnosis present

## 2023-04-02 DIAGNOSIS — Z3A37 37 weeks gestation of pregnancy: Secondary | ICD-10-CM | POA: Diagnosis not present

## 2023-04-02 DIAGNOSIS — O99214 Obesity complicating childbirth: Secondary | ICD-10-CM | POA: Diagnosis present

## 2023-04-02 LAB — ABO/RH: ABO/RH(D): A POS

## 2023-04-02 SURGERY — Surgical Case
Anesthesia: Spinal

## 2023-04-02 MED ORDER — MORPHINE SULFATE (PF) 0.5 MG/ML IJ SOLN
INTRAMUSCULAR | Status: AC
  Filled 2023-04-02: qty 10

## 2023-04-02 MED ORDER — FENTANYL CITRATE (PF) 100 MCG/2ML IJ SOLN
INTRAMUSCULAR | Status: AC
  Filled 2023-04-02: qty 2

## 2023-04-02 MED ORDER — NALOXONE HCL 4 MG/10ML IJ SOLN: 1.0000 ug/kg/h | INTRAVENOUS | Status: DC | PRN

## 2023-04-02 MED ORDER — SENNOSIDES-DOCUSATE SODIUM 8.6-50 MG PO TABS
2.0000 | ORAL_TABLET | Freq: Every day | ORAL | Status: DC
  Administered 2023-04-03: 2 via ORAL
  Filled 2023-04-02 (×2): qty 2

## 2023-04-02 MED ORDER — ONDANSETRON HCL 4 MG/2ML IJ SOLN
INTRAMUSCULAR | Status: AC
  Filled 2023-04-02: qty 2

## 2023-04-02 MED ORDER — HYDROMORPHONE HCL 1 MG/ML IJ SOLN: 0.2500 mg | INTRAMUSCULAR | Status: DC | PRN

## 2023-04-02 MED ORDER — EPHEDRINE SULFATE-NACL 50-0.9 MG/10ML-% IV SOSY
PREFILLED_SYRINGE | INTRAVENOUS | Status: DC | PRN
  Administered 2023-04-02: 10 mg via INTRAVENOUS

## 2023-04-02 MED ORDER — CEFAZOLIN SODIUM-DEXTROSE 2-4 GM/100ML-% IV SOLN
INTRAVENOUS | Status: AC
  Filled 2023-04-02: qty 100

## 2023-04-02 MED ORDER — POVIDONE-IODINE 10 % EX SWAB
2.0000 | Freq: Once | CUTANEOUS | Status: AC
  Administered 2023-04-02: 2 via TOPICAL

## 2023-04-02 MED ORDER — KETOROLAC TROMETHAMINE 30 MG/ML IJ SOLN
INTRAMUSCULAR | Status: AC
  Filled 2023-04-02: qty 1

## 2023-04-02 MED ORDER — MORPHINE SULFATE (PF) 0.5 MG/ML IJ SOLN
INTRAMUSCULAR | Status: DC | PRN
  Administered 2023-04-02: 150 ug via INTRATHECAL

## 2023-04-02 MED ORDER — BUPIVACAINE IN DEXTROSE 0.75-8.25 % IT SOLN
INTRATHECAL | Status: DC | PRN
  Administered 2023-04-02: 12 mg via INTRATHECAL

## 2023-04-02 MED ORDER — PHENYLEPHRINE 80 MCG/ML (10ML) SYRINGE FOR IV PUSH (FOR BLOOD PRESSURE SUPPORT)
PREFILLED_SYRINGE | INTRAVENOUS | Status: DC | PRN
  Administered 2023-04-02: 80 ug via INTRAVENOUS

## 2023-04-02 MED ORDER — KETOROLAC TROMETHAMINE 30 MG/ML IJ SOLN
30.0000 mg | Freq: Once | INTRAMUSCULAR | Status: AC | PRN
  Administered 2023-04-02: 30 mg via INTRAVENOUS

## 2023-04-02 MED ORDER — MENTHOL 3 MG MT LOZG: 1.0000 | LOZENGE | OROMUCOSAL | Status: DC | PRN

## 2023-04-02 MED ORDER — SIMETHICONE 80 MG PO CHEW: 80.0000 mg | CHEWABLE_TABLET | ORAL | Status: DC | PRN

## 2023-04-02 MED ORDER — OXYTOCIN-SODIUM CHLORIDE 30-0.9 UT/500ML-% IV SOLN
2.5000 [IU]/h | INTRAVENOUS | Status: AC
  Administered 2023-04-02: 2.5 [IU]/h via INTRAVENOUS

## 2023-04-02 MED ORDER — OXYTOCIN-SODIUM CHLORIDE 30-0.9 UT/500ML-% IV SOLN
INTRAVENOUS | Status: AC
  Filled 2023-04-02: qty 500

## 2023-04-02 MED ORDER — IBUPROFEN 600 MG PO TABS
600.0000 mg | ORAL_TABLET | Freq: Four times a day (QID) | ORAL | Status: DC
  Administered 2023-04-03 – 2023-04-04 (×3): 600 mg via ORAL
  Filled 2023-04-02 (×3): qty 1

## 2023-04-02 MED ORDER — DIPHENHYDRAMINE HCL 25 MG PO CAPS: 25.0000 mg | ORAL_CAPSULE | Freq: Four times a day (QID) | ORAL | Status: DC | PRN

## 2023-04-02 MED ORDER — LACTATED RINGERS IV SOLN: INTRAVENOUS | Status: DC

## 2023-04-02 MED ORDER — OXYCODONE HCL 5 MG PO TABS: 5.0000 mg | ORAL_TABLET | ORAL | Status: DC | PRN

## 2023-04-02 MED ORDER — ACETAMINOPHEN 500 MG PO TABS
1000.0000 mg | ORAL_TABLET | Freq: Four times a day (QID) | ORAL | Status: DC
  Administered 2023-04-02 – 2023-04-04 (×7): 1000 mg via ORAL
  Filled 2023-04-02 (×8): qty 2

## 2023-04-02 MED ORDER — ACETAMINOPHEN 500 MG PO TABS
ORAL_TABLET | ORAL | Status: AC
  Filled 2023-04-02: qty 2

## 2023-04-02 MED ORDER — PHENYLEPHRINE HCL-NACL 20-0.9 MG/250ML-% IV SOLN
INTRAVENOUS | Status: AC
  Filled 2023-04-02: qty 250

## 2023-04-02 MED ORDER — ONDANSETRON HCL 4 MG/2ML IJ SOLN
INTRAMUSCULAR | Status: DC | PRN
  Administered 2023-04-02: 4 mg via INTRAVENOUS

## 2023-04-02 MED ORDER — FENTANYL CITRATE (PF) 100 MCG/2ML IJ SOLN
INTRAMUSCULAR | Status: DC | PRN
  Administered 2023-04-02: 15 ug via INTRATHECAL

## 2023-04-02 MED ORDER — DIPHENHYDRAMINE HCL 25 MG PO CAPS: 25.0000 mg | ORAL_CAPSULE | ORAL | Status: DC | PRN

## 2023-04-02 MED ORDER — OXYCODONE HCL 5 MG/5ML PO SOLN: 5.0000 mg | Freq: Once | ORAL | Status: DC | PRN

## 2023-04-02 MED ORDER — NALOXONE HCL 0.4 MG/ML IJ SOLN: 0.4000 mg | INTRAMUSCULAR | Status: DC | PRN

## 2023-04-02 MED ORDER — ONDANSETRON HCL 4 MG/2ML IJ SOLN: 4.0000 mg | Freq: Once | INTRAMUSCULAR | Status: DC | PRN

## 2023-04-02 MED ORDER — MEPERIDINE HCL 25 MG/ML IJ SOLN: 6.2500 mg | INTRAMUSCULAR | Status: DC | PRN

## 2023-04-02 MED ORDER — DIPHENHYDRAMINE HCL 50 MG/ML IJ SOLN: 12.5000 mg | INTRAMUSCULAR | Status: DC | PRN

## 2023-04-02 MED ORDER — DIBUCAINE (PERIANAL) 1 % EX OINT: 1.0000 | TOPICAL_OINTMENT | CUTANEOUS | Status: DC | PRN

## 2023-04-02 MED ORDER — WITCH HAZEL-GLYCERIN EX PADS: 1.0000 | MEDICATED_PAD | CUTANEOUS | Status: DC | PRN

## 2023-04-02 MED ORDER — SCOPOLAMINE 1 MG/3DAYS TD PT72
MEDICATED_PATCH | TRANSDERMAL | Status: AC
  Filled 2023-04-02: qty 1

## 2023-04-02 MED ORDER — SIMETHICONE 80 MG PO CHEW
80.0000 mg | CHEWABLE_TABLET | Freq: Three times a day (TID) | ORAL | Status: DC
  Administered 2023-04-02 – 2023-04-04 (×5): 80 mg via ORAL
  Filled 2023-04-02 (×5): qty 1

## 2023-04-02 MED ORDER — SCOPOLAMINE 1 MG/3DAYS TD PT72
1.0000 | MEDICATED_PATCH | Freq: Once | TRANSDERMAL | Status: DC
  Administered 2023-04-02: 1.5 mg via TRANSDERMAL

## 2023-04-02 MED ORDER — TRANEXAMIC ACID-NACL 1000-0.7 MG/100ML-% IV SOLN
INTRAVENOUS | Status: DC | PRN
  Administered 2023-04-02: 1000 mg via INTRAVENOUS

## 2023-04-02 MED ORDER — PHENYLEPHRINE HCL-NACL 20-0.9 MG/250ML-% IV SOLN
INTRAVENOUS | Status: DC | PRN
  Administered 2023-04-02: 60 ug/min via INTRAVENOUS

## 2023-04-02 MED ORDER — COCONUT OIL OIL: 1.0000 | TOPICAL_OIL | Status: DC | PRN

## 2023-04-02 MED ORDER — KETOROLAC TROMETHAMINE 30 MG/ML IJ SOLN
30.0000 mg | Freq: Four times a day (QID) | INTRAMUSCULAR | Status: AC
  Administered 2023-04-02 – 2023-04-03 (×3): 30 mg via INTRAVENOUS
  Filled 2023-04-02 (×4): qty 1

## 2023-04-02 MED ORDER — PRENATAL MULTIVITAMIN CH
1.0000 | ORAL_TABLET | Freq: Every day | ORAL | Status: DC
  Administered 2023-04-03: 1 via ORAL
  Filled 2023-04-02: qty 1

## 2023-04-02 MED ORDER — OXYTOCIN-SODIUM CHLORIDE 30-0.9 UT/500ML-% IV SOLN
INTRAVENOUS | Status: DC | PRN
  Administered 2023-04-02: 30 [IU] via INTRAVENOUS

## 2023-04-02 MED ORDER — OXYCODONE HCL 5 MG PO TABS: 5.0000 mg | ORAL_TABLET | Freq: Once | ORAL | Status: DC | PRN

## 2023-04-02 MED ORDER — ZOLPIDEM TARTRATE 5 MG PO TABS: 5.0000 mg | ORAL_TABLET | Freq: Every evening | ORAL | Status: DC | PRN

## 2023-04-02 MED ORDER — ONDANSETRON HCL 4 MG/2ML IJ SOLN: 4.0000 mg | Freq: Three times a day (TID) | INTRAMUSCULAR | Status: DC | PRN

## 2023-04-02 MED ORDER — DEXAMETHASONE SODIUM PHOSPHATE 10 MG/ML IJ SOLN
INTRAMUSCULAR | Status: AC
  Filled 2023-04-02: qty 1

## 2023-04-02 MED ORDER — CEFAZOLIN SODIUM-DEXTROSE 2-4 GM/100ML-% IV SOLN
2.0000 g | INTRAVENOUS | Status: AC
  Administered 2023-04-02: 2 g via INTRAVENOUS

## 2023-04-02 MED ORDER — SODIUM CHLORIDE 0.9% FLUSH: 3.0000 mL | INTRAVENOUS | Status: DC | PRN

## 2023-04-02 MED ORDER — ACETAMINOPHEN 500 MG PO TABS
1000.0000 mg | ORAL_TABLET | ORAL | Status: AC
  Administered 2023-04-02: 1000 mg via ORAL

## 2023-04-02 SURGICAL SUPPLY — 35 items
ADH SKN CLS APL DERMABOND .7 (GAUZE/BANDAGES/DRESSINGS) ×1
APL PRP STRL LF DISP 70% ISPRP (MISCELLANEOUS) ×2
CHLORAPREP W/TINT 26 (MISCELLANEOUS) ×2 IMPLANT
CLAMP UMBILICAL CORD (MISCELLANEOUS) ×1 IMPLANT
CLOTH BEACON ORANGE TIMEOUT ST (SAFETY) ×1 IMPLANT
DERMABOND ADVANCED .7 DNX12 (GAUZE/BANDAGES/DRESSINGS) ×1 IMPLANT
DRSG OPSITE POSTOP 4X10 (GAUZE/BANDAGES/DRESSINGS) ×1 IMPLANT
ELECT REM PT RETURN 9FT ADLT (ELECTROSURGICAL) ×1
ELECTRODE REM PT RTRN 9FT ADLT (ELECTROSURGICAL) ×1 IMPLANT
EXTRACTOR VACUUM BELL STYLE (SUCTIONS) IMPLANT
GAUZE SPONGE 4X4 12PLY STRL LF (GAUZE/BANDAGES/DRESSINGS) IMPLANT
GLOVE BIO SURGEON STRL SZ 6.5 (GLOVE) ×1 IMPLANT
GLOVE BIOGEL PI IND STRL 6.5 (GLOVE) ×1 IMPLANT
GLOVE BIOGEL PI IND STRL 7.0 (GLOVE) ×2 IMPLANT
GOWN STRL REUS W/TWL LRG LVL3 (GOWN DISPOSABLE) ×2 IMPLANT
KIT ABG SYR 3ML LUER SLIP (SYRINGE) IMPLANT
NDL HYPO 25X5/8 SAFETYGLIDE (NEEDLE) IMPLANT
NEEDLE HYPO 25X5/8 SAFETYGLIDE (NEEDLE) IMPLANT
NS IRRIG 1000ML POUR BTL (IV SOLUTION) ×1 IMPLANT
PACK C SECTION WH (CUSTOM PROCEDURE TRAY) ×1 IMPLANT
PAD ABD 8X10 STRL (GAUZE/BANDAGES/DRESSINGS) IMPLANT
PAD OB MATERNITY 4.3X12.25 (PERSONAL CARE ITEMS) ×1 IMPLANT
RTRCTR C-SECT PINK 25CM LRG (MISCELLANEOUS) IMPLANT
SUT PDS AB 0 CTX 36 PDP370T (SUTURE) IMPLANT
SUT PLAIN 2 0 (SUTURE)
SUT PLAIN ABS 2-0 CT1 27XMFL (SUTURE) IMPLANT
SUT VIC AB 0 CT1 36 (SUTURE) ×2 IMPLANT
SUT VIC AB 2-0 CT1 27 (SUTURE) ×1
SUT VIC AB 2-0 CT1 TAPERPNT 27 (SUTURE) ×1 IMPLANT
SUT VIC AB 3-0 SH 27 (SUTURE)
SUT VIC AB 3-0 SH 27X BRD (SUTURE) IMPLANT
SUT VIC AB 4-0 KS 27 (SUTURE) ×1 IMPLANT
TOWEL OR 17X24 6PK STRL BLUE (TOWEL DISPOSABLE) ×1 IMPLANT
TRAY FOLEY W/BAG SLVR 14FR LF (SET/KITS/TRAYS/PACK) ×1 IMPLANT
WATER STERILE IRR 1000ML POUR (IV SOLUTION) ×1 IMPLANT

## 2023-04-02 NOTE — H&P (Signed)
Karsin Molinero is a 42 y.o. female presenting for scheduled section. +FM< denies VB, LOF, CTX.   PNC c/b 1) h/o cornual ectopic resection - per REI, recommend primary section between 37-38 weeks 2)  IVF pregnancy - embryo implantation(last), eggs harvested at 42yo, FET PGT-a testind embryo 3) BMI 36 - baby ASA  GBS neg OB History     Gravida  3   Para      Term      Preterm      AB  2   Living         SAB      IAB      Ectopic  2   Multiple      Live Births             Past Medical History:  Diagnosis Date   Asthma    Carpal tunnel syndrome during pregnancy    Complication of anesthesia    slow to wake up   Endometritis    Past Surgical History:  Procedure Laterality Date   BILATERAL SALPINGECTOMY     ectopic pregnacy  02/2014   laproscopic surgery Right    Fallopian tube was removed   Family History: family history includes Asthma in her mother; Heart block in her father; Hypertension in her father. Social History:  reports that she has never smoked. She has never used smokeless tobacco. She reports that she does not currently use alcohol. She reports that she does not use drugs.     Maternal Diabetes: No1hr 140, declined 3hr GTT, FSBG WNL Genetic Screening: Normal Maternal Ultrasounds/Referrals: Normal Fetal Ultrasounds or other Referrals:  None Maternal Substance Abuse:  No Significant Maternal Medications:  None Significant Maternal Lab Results:  Group B Strep negative Number of Prenatal Visits:greater than 3 verified prenatal visits Other Comments:  None  Review of Systems  Constitutional:  Negative for chills and fever.  Respiratory:  Negative for shortness of breath.   Cardiovascular:  Negative for chest pain, palpitations and leg swelling.  Gastrointestinal:  Negative for abdominal pain and vomiting.  Neurological:  Negative for dizziness, weakness and headaches.  Psychiatric/Behavioral:  Negative for suicidal ideas.    Maternal Medical  History:  Contractions: Frequency: rare.   Fetal activity: Perceived fetal activity is normal.   Prenatal complications: No PIH or IUGR.   Prenatal Complications - Diabetes: none.     Blood pressure 121/82, pulse 91, temperature 97.9 F (36.6 C), temperature source Oral, resp. rate 16, height 5\' 3"  (1.6 m), weight 92.9 kg, SpO2 95 %. Exam Physical Exam Constitutional:      General: She is not in acute distress.    Appearance: She is well-developed.  HENT:     Head: Normocephalic and atraumatic.  Eyes:     Pupils: Pupils are equal, round, and reactive to light.  Cardiovascular:     Rate and Rhythm: Normal rate and regular rhythm.     Heart sounds: No murmur heard.    No gallop.  Abdominal:     Tenderness: There is no abdominal tenderness. There is no guarding or rebound.  Genitourinary:    Vagina: Normal.  Musculoskeletal:        General: Normal range of motion.     Cervical back: Normal range of motion and neck supple.  Skin:    General: Skin is warm and dry.  Neurological:     Mental Status: She is alert and oriented to person, place, and time.     Prenatal  labs: ABO, Rh: --/--/A POS Performed at [facility], [address], [place], [place] [address]  [phone]) Antibody: NEG (07/03 1051) Rubella: Immune (11/30 0000) RPR: NON REACTIVE (07/03 1130)  HBsAg: Negative (11/30 0000)  HIV: Non-reactive (11/30 0000)  GBS:   neg  Assessment/Plan: This is a 42yo G3P0020 @ 37 2/7 by date of transfer admitted for scheduled PLTCS for h/o cornual ectopic resection. Extensive counseling regarding postoperative pain control plus expectations R/B/A of cesarean section discussed with patient. Alternative would be vaginal delivery which would mean shorter postpartum stay and decreased risk of bleeding. Risks of section include infection of the uterus, pelvic organs, or skin, inadvertent injury to internal organs, such as bowel or bladder. If there is major injury, extensive  surgery may be required. If injury is minor, it may be treated with relative ease. Discussed possibility of excessive blood loss and transfusion. If bleeding cannot be controlled using medical or minor surgical methods, a cesarean hysterectomy may be performed which would mean no future fertility. Patient accepts the possibility of blood transfusion, if necessary. Patient understands and agrees to move forward with section.    Valerie Roys Daryus Sowash 04/02/2023, 12:29 PM

## 2023-04-02 NOTE — Anesthesia Postprocedure Evaluation (Signed)
Anesthesia Post Note  Patient: Shelby Ellis  Procedure(s) Performed: CESAREAN SECTION     Patient location during evaluation: Mother Baby Anesthesia Type: Spinal Level of consciousness: oriented and awake and alert Pain management: pain level controlled Vital Signs Assessment: post-procedure vital signs reviewed and stable Respiratory status: spontaneous breathing and respiratory function stable Cardiovascular status: blood pressure returned to baseline and stable Postop Assessment: no headache, no backache, no apparent nausea or vomiting and able to ambulate Anesthetic complications: no   No notable events documented.  Last Vitals:  Vitals:   04/02/23 1448 04/02/23 1500  BP: 114/71 108/61  Pulse: 81 69  Resp: 10 (!) 23  Temp:    SpO2: 98% 98%    Last Pain:  Vitals:   04/02/23 1448  TempSrc:   PainSc: 2    Pain Goal:    LLE Motor Response: Purposeful movement (04/02/23 1448) LLE Sensation: Tingling (04/02/23 1448) RLE Motor Response: Purposeful movement (04/02/23 1448) RLE Sensation: Tingling (04/02/23 1448)     Epidural/Spinal Function Cutaneous sensation: Able to Discern Pressure (04/02/23 1448), Patient able to flex knees: No (04/02/23 1448), Patient able to lift hips off bed: No (04/02/23 1448), Back pain beyond tenderness at insertion site: No (04/02/23 1448), Progressively worsening motor and/or sensory loss: No (04/02/23 1448), Bowel and/or bladder incontinence post epidural: No (04/02/23 1448)  Trevor Iha

## 2023-04-02 NOTE — Lactation Note (Signed)
This note was copied from a baby's chart. Lactation Consultation Note  Patient Name: Shelby Ellis [id number] Date: 04/02/2023 Age:43 hours Reason for consult: Initial assessment;Mother's request;Primapara;1st time breastfeeding;Early term 37-38.6wks;Other (Comment) (IVF)  Initial visit to 3 hours old infant. Birthing parent requests assistance with feeding. Demonstrated hand expression, unable to see colostrum, parent verbalizes discomfort with technique. Parent reports abdominal cramps when doing stimulating breasts. Provided hand pump for colostrum expression with 21-mm flange, parent verbalizes discomfort, unable to see colostrum. Infant is skin to skin due to low temp. Parent brought her own formula [employer]) and personal baby formula dispenser (baby brezza).   Discussed normal newborn behavior and patterns, tummy size and benefits of skin to skin. Parent states she is tired from surgery.    Plan: 1-Feeding on demand or 8-12 times in 24h period. 2-Use manual pump as needed 3-Encouraged birthing parent rest, hydration and food intake.   Contact LC as needed for feeds/support/concerns/questions.   Maternal Data Has patient been taught Hand Expression?: Yes Does the patient have breastfeeding experience prior to this delivery?: No  Feeding Mother's Current Feeding Choice: Breast Milk and Formula (Personal formula: Kendamil)  LATCH Score Latch: Too sleepy or reluctant, no latch achieved, no sucking elicited.  Audible Swallowing: None  Type of Nipple: Everted at rest and after stimulation  Comfort (Breast/Nipple): Soft / non-tender  Hold (Positioning): Assistance needed to correctly position infant at breast and maintain latch.  LATCH Score: 5   Lactation Tools Discussed/Used Tools: Pump;Flanges Flange Size: 21 Breast pump type: Manual Pump Education: Milk Storage;Setup, frequency, and cleaning Reason for Pumping: stimulation and supplementation Pumping frequency: as  needed  Interventions Interventions: Skin to skin;Breast massage;Hand express;Hand pump;Expressed milk;Education  Discharge Pump: Personal;DEBP;Manual (Spectra) WIC Program: No  Consult Status Consult Status: Follow-up Date: 04/03/23 Follow-up type: Call as needed    Havah Ammon A Higuera Ancidey 04/02/2023, 5:29 PM

## 2023-04-02 NOTE — Anesthesia Procedure Notes (Signed)
Spinal  Patient location during procedure: OB Start time: 04/02/2023 12:56 PM End time: 04/02/2023 12:59 PM Reason for block: surgical anesthesia Staffing Performed: anesthesiologist  Anesthesiologist: Trevor Iha, MD Performed by: Trevor Iha, MD Authorized by: Trevor Iha, MD   Preanesthetic Checklist Completed: patient identified, IV checked, risks and benefits discussed, surgical consent, monitors and equipment checked, pre-op evaluation and timeout performed Spinal Block Patient position: sitting Prep: DuraPrep and site prepped and draped Patient monitoring: heart rate, cardiac monitor, continuous pulse ox and blood pressure Approach: midline Location: L3-4 Injection technique: single-shot Needle Needle type: Pencan  Needle gauge: 24 G Needle length: 10 cm Needle insertion depth: 6 cm Assessment Sensory level: T4 Events: CSF return Additional Notes 1 Attempt (s). Pt tolerated procedure well.

## 2023-04-02 NOTE — Transfer of Care (Signed)
Immediate Anesthesia Transfer of Care Note  Patient: Shelby Ellis  Procedure(s) Performed: CESAREAN SECTION  Patient Location: PACU  Anesthesia Type:Spinal  Level of Consciousness: awake, alert , and oriented  Airway & Oxygen Therapy: Patient Spontanous Breathing  Post-op Assessment: Report given to RN and Post -op Vital signs reviewed and stable  Post vital signs: Reviewed and stable  Last Vitals:  Vitals Value Taken Time  BP 87/36 04/02/23 1431  Temp 36.2 C 04/02/23 1415  Pulse 81 04/02/23 1433  Resp 16 04/02/23 1433  SpO2 99 % 04/02/23 1433  Vitals shown include unvalidated device data.  Last Pain:  Vitals:   04/02/23 1415  TempSrc:   PainSc: 0-No pain         Complications: No notable events documented.

## 2023-04-02 NOTE — Op Note (Signed)
C-Section Operative Note  Date: 04/02/23  Preoperative Diagnosis: IUP @ 37 2/7 Postoperative Diagnosis: same as above Procedure: PLTCS Indications: h/o cornual ectopic Findings: Viable female infant weighing 3280g with APGARS of 8 and 9 at 1 and 5 minutes, respectively. Normal appearing uterus and ovaries. Evidence of BL salpingectomy from ectopic history. Specimens: placenta to L&D EBL 900 IVF 2500 UOP 100  Patient Course: Admitted for scheduled PLTCS for h/o cornual ectopic, delivery at 37-38wks per REI  Consent:  R/B/A of cesarean section discussed with patient. Alternative would be vaginal delivery which would mean shorter postpartum stay and decreased risk of bleeding. Risks of section include infection of the uterus, pelvic organs, or skin, inadvertent injury to internal organs, such as bowel or bladder. If there is major injury, extensive surgery may be required. If injury is minor, it may be treated with relative ease. Discussed possibility of excessive blood loss and transfusion. If bleeding cannot be controlled using medical or minor surgical methods, a cesarean hysterectomy may be performed which would mean no future fertility. Patient accepts the possibility of blood transfusion, if necessary. Patient understands and agrees to move forward with section.   Operative Procedure: Patient was taken to the operating room where epidural anesthesia was found to be adequate by Allis clamp test. She was prepped and draped in the normal sterile fashion in the dorsal supine position with a leftward tilt. An appropriate time out was performed. A Pfannenstiel skin incision was then made with the scalpel and carried through to the underlying layer of fascia by sharp dissection and Bovie cautery. The fascia was nicked in the midline and the incision was extended laterally with Mayo scissors. The superior aspect of the incision was grasped Coker clamps and dissected off the underlying rectus muscles. In  a similar fashion the inferior aspect was dissected off the rectus muscles. Rectus muscles were separated in the midline and the peritoneal cavity entered bluntly. The peritoneal incision was then extended both superiorly and inferiorly with careful attention to avoid both bowel and bladder. The Alexis self-retaining wound retractor was then placed within the incision and the lower uterine segment exposed. The bladder flap was developed with Metzenbaum scissors and pushed away from the lower uterine segment. The lower uterine segment was then incised in a low tranvserse fashion and the cavity itself entered bluntly. The incision was extended bluntly. Amniotic sac was ruptured and fluid was noted to be clear in color. The infant's head was then lifted and delivered from the incision without difficulty using the standard movements. The remainder of the infant delivered and the nose and mouth bulb suctioned with the cord clamped and cut as well. The infant was handed off to the NICU. The placenta was then spontaneously expressed from the uterus and the uterus cleared of all clots and debris with moist lap sponge. The uterine incision was then repaired in 2 layers the first layer was a running locked layer of 0-vicryl and the second an imbricating layer of the same suture. The tubes and ovaries were inspected and the gutters cleared of all clots and debris. The uterine incision was inspected and found to be hemostatic. All instruments and sponges as well as the Alexis retractor were then removed from the abdomen. The peritoneum was then reapproximated using 2-0 suture. The fascia was then closed with 0 PDS in a running fashion. Subcutaneous tissue was reapproximated with 3-0 plain in a running fashion. The skin was closed with a subcuticular stitch of 4-0 Vicryl on  a Keith needle and then reinforced with Dermabond and a Honeycomb. At the conclusion of the procedure all instruments and sponge counts were correct. Patient  was taken to the recovery room in good condition with her baby accompanying her skin to skin.

## 2023-04-03 ENCOUNTER — Encounter (HOSPITAL_COMMUNITY): Payer: Self-pay | Admitting: Obstetrics and Gynecology

## 2023-04-03 LAB — CBC
HCT: 29.5 % — ABNORMAL LOW (ref 36.0–46.0)
Hemoglobin: 9.4 g/dL — ABNORMAL LOW (ref 12.0–15.0)
MCH: 31.1 pg (ref 26.0–34.0)
MCHC: 31.9 g/dL (ref 30.0–36.0)
MCV: 97.7 fL (ref 80.0–100.0)
Platelets: 214 10*3/uL (ref 150–400)
RBC: 3.02 MIL/uL — ABNORMAL LOW (ref 3.87–5.11)
RDW: 15.3 % (ref 11.5–15.5)
WBC: 9.3 10*3/uL (ref 4.0–10.5)
nRBC: 0 % (ref 0.0–0.2)

## 2023-04-03 NOTE — Progress Notes (Signed)
Subjective: Postpartum Day 1: Cesarean Delivery Patient reports tolerating PO, + flatus, and no problems voiding.   She reports pain well controlled with meds. She is bonding well with baby - breastfeeding. She denies lightheadedness, lochia mild. No CP or SOB.   Objective: Vital signs in last 24 hours: Temp:  [97.1 F (36.2 C)-98.6 F (37 C)] 97.7 F (36.5 C) (07/06 0901) Pulse Rate:  [66-91] 77 (07/06 0901) Resp:  [10-23] 18 (07/06 0901) BP: (95-121)/(57-82) 103/59 (07/06 0901) SpO2:  [95 %-100 %] 98 % (07/06 0901) Weight:  [92.9 kg] 92.9 kg (07/05 1046)  Physical Exam:  General: alert, cooperative, and no distress Lochia: appropriate Uterine Fundus: firm Incision: no significant drainage DVT Evaluation: No evidence of DVT seen on physical exam.  Recent Labs    03/31/23 1130 04/03/23 0448  HGB 12.2 9.4*  HCT 37.6 29.5*    Assessment/Plan: Status post Cesarean section. Doing well postoperatively.  Continue current care Possible discharge home tomorrow .  Cathrine Muster, DO 04/03/2023, 10:23 AM

## 2023-04-03 NOTE — Lactation Note (Signed)
This note was copied from a baby's chart. Lactation Consultation Note  Patient Name: Shelby Ellis [id number] Date: 04/03/2023 Age:43 hours  Mother has only been formula feeding her baby with formula that she brought from home. She has not requested lactation assistance. Infant is an early term infant and born at 37.[redacted] weeks gestation. Mother has been educated on pumping to stimulate her milk supply and RN reports she has reinforced teaching.       Consult Status  Complete ( will see at mother/ care provider request)    Omar Person 04/03/2023, 6:12 PM

## 2023-04-04 MED ORDER — IBUPROFEN 600 MG PO TABS: 600.0000 mg | ORAL_TABLET | Freq: Four times a day (QID) | ORAL | 1 refills | Status: AC | PRN

## 2023-04-04 MED ORDER — OXYCODONE HCL 5 MG PO TABS: 5.0000 mg | ORAL_TABLET | ORAL | 0 refills | Status: AC | PRN

## 2023-04-04 MED ORDER — ACETAMINOPHEN 325 MG PO TABS: 1000.0000 mg | ORAL_TABLET | Freq: Four times a day (QID) | ORAL | Status: AC | PRN

## 2023-04-04 NOTE — Discharge Summary (Signed)
Postpartum Discharge Summary  Date of Service updated 04/04/23      Patient Name: Shelby Ellis DOB: 26-Mar-1981 MRN: [id number]  Date of admission: 04/02/2023 Delivery date:04/02/2023  Delivering provider: Carlisle Cater  Date of discharge: 04/04/2023  Admitting diagnosis: History of ectopic pregnancy [Z87.59] History of cesarean section [Z98.891] Intrauterine pregnancy: [redacted]w[redacted]d     Secondary diagnosis:  Principal Problem:   History of ectopic pregnancy Active Problems:   History of cesarean section  Additional problems: obesity, IVF pregnancy    Discharge diagnosis: Term Pregnancy Delivered                                              Post partum procedures: none Augmentation: N/A Complications: None  Hospital course: Sceduled C/S   42 y.o. yo G3P1021 at [redacted]w[redacted]d was admitted to the hospital 04/02/2023 for scheduled cesarean section with the following indication: history of cornual ectopic .Delivery details are as follows:  Membrane Rupture Time/Date: 1:26 PM ,04/02/2023   Delivery Method:C-Section, Low Transverse  Details of operation can be found in separate operative note.  Patient had a postpartum course complicated by ntothing.  She is ambulating, tolerating a regular diet, passing flatus, and urinating well. Patient is discharged home in stable condition on  04/04/23        Newborn Data: Birth date:04/02/2023  Birth time:1:27 PM  Gender:Female  Living status:Living  Apgars:8 ,9  Weight:3280 g     Magnesium Sulfate received: No BMZ received: No Rhophylac:N/A MMR:N/A Transfusion:No  Physical exam  Vitals:   04/03/23 0901 04/03/23 1300 04/03/23 2001 04/04/23 0538  BP: (!) 103/59 99/70 114/66 112/73  Pulse: 77 72 81 78  Resp: 18 18 17 18   Temp: 97.7 F (36.5 C) 98.2 F (36.8 C) 98.2 F (36.8 C) (!) 96.8 F (36 C)  TempSrc: Axillary Axillary Axillary Axillary  SpO2: 98% 98% 99% 98%  Weight:      Height:       General: alert, cooperative, and no distress Lochia:  appropriate Uterine Fundus: firm Incision: Dressing is clean, dry, and intact DVT Evaluation: No evidence of DVT seen on physical exam. Labs: Lab Results  Component Value Date   WBC 9.3 04/03/2023   HGB 9.4 (L) 04/03/2023   HCT 29.5 (L) 04/03/2023   MCV 97.7 04/03/2023   PLT 214 04/03/2023      Latest Ref Rng & Units 01/07/2022   11:47 AM  CMP  Glucose 70 - 99 mg/dL 89   BUN 6 - 23 mg/dL 13   Creatinine 4.09 - 1.20 mg/dL 8.11   Sodium 914 - 782 mEq/L 138   Potassium 3.5 - 5.1 mEq/L 4.9   Chloride 96 - 112 mEq/L 107   CO2 19 - 32 mEq/L 24   Calcium 8.4 - 10.5 mg/dL 9.1   Total Protein 6.0 - 8.3 g/dL 7.5   Total Bilirubin 0.2 - 1.2 mg/dL 0.3   Alkaline Phos 39 - 117 U/L 43   AST 0 - 37 U/L 20   ALT 0 - 35 U/L 16    Edinburgh Score:    04/03/2023    7:22 AM  Edinburgh Postnatal Depression Scale Screening Tool  I have been able to laugh and see the funny side of things. 0  I have looked forward with enjoyment to things. 0  I have blamed myself unnecessarily when things  went wrong. 0  I have been anxious or worried for no good reason. 2  I have felt scared or panicky for no good reason. 0  Things have been getting on top of me. 0  I have been so unhappy that I have had difficulty sleeping. 0  I have felt sad or miserable. 0  I have been so unhappy that I have been crying. 0  The thought of harming myself has occurred to me. 0  Edinburgh Postnatal Depression Scale Total 2      After visit meds:  Allergies as of 04/04/2023       Reactions   Shellfish Allergy Swelling   Shrimp (diagnostic) Swelling   Lactose Intolerance (gi) Itching, Rash   Pork-derived Products Itching, Rash        Medication List     STOP taking these medications    aspirin EC 81 MG tablet       TAKE these medications    acetaminophen 325 MG tablet Commonly known as: TYLENOL Take 3 tablets (975 mg total) by mouth every 6 (six) hours as needed.   EPINEPHrine 0.3 mg/0.3 mL Soaj  injection Commonly known as: EPI-PEN Inject 0.3 mg into the muscle as needed for anaphylaxis.   ferrous sulfate 325 (65 FE) MG tablet Take 325 mg by mouth every other day. At night   ibuprofen 600 MG tablet Commonly known as: ADVIL Take 1 tablet (600 mg total) by mouth every 6 (six) hours as needed.   OMEGA 3 PO Take 1 capsule by mouth in the morning.   oxyCODONE 5 MG immediate release tablet Commonly known as: Oxy IR/ROXICODONE Take 1 tablet (5 mg total) by mouth every 4 (four) hours as needed for moderate pain or severe pain.   PRENATAL 1 PO Take 1 tablet by mouth daily after lunch.         Discharge home in stable condition Infant Feeding: Breast Infant Disposition:home with mother Discharge instruction: per After Visit Summary and Postpartum booklet. Activity: Advance as tolerated. Pelvic rest for 6 weeks.  Diet: routine diet Anticipated Birth Control:  s/p bilateral salpingectomy Postpartum Appointment:6 weeks Additional Postpartum F/U: Incision check 1 week Future Appointments:No future appointments. Follow up Visit:  Follow-up Information     [facility]. Schedule an appointment as soon as possible for a visit in 1 week(s).   Why: Incision check Contact information: [address]  [address] [place] [address] [phone]                     04/04/2023 [name], MD

## 2023-04-04 NOTE — Progress Notes (Signed)
  Subjective: Postpartum Day 2: Cesarean Delivery Patient is doing well this morning. Pain is controlled. Ambulating, voiding, tolerating PO. Minimal lochia. Breastfeeding.   Objective: Patient Vitals for the past 24 hrs:  BP Temp Temp src Pulse Resp SpO2  04/04/23 0538 112/73 (!) 96.8 F (36 C) Axillary 78 18 98 %  04/03/23 2001 114/66 98.2 F (36.8 C) Axillary 81 17 99 %  04/03/23 1300 99/70 98.2 F (36.8 C) Axillary 72 18 98 %  04/03/23 0901 (!) 103/59 97.7 F (36.5 C) Axillary 77 18 98 %     Physical Exam:  General: alert, appears stated age, and no distress Lochia: appropriate Uterine Fundus: firm Incision: healing well, no significant drainage, no dehiscence, no significant erythema DVT Evaluation: No evidence of DVT seen on physical exam.  Recent Labs    04/03/23 0448  HGB 9.4*  HCT 29.5*    Assessment/Plan: Shelby Ellis [id number] POD#2 sp PCS at  [redacted]w[redacted]d for history of cornual ectopic 1. PPC: routine PP care 2. Rh pos 3. Dispo: desires discharge home today, instructions reviewed  Shelby Ellis 04/04/2023, 8:58 AM

## 2023-04-27 ENCOUNTER — Telehealth (HOSPITAL_COMMUNITY): Payer: Self-pay

## 2023-04-27 NOTE — Telephone Encounter (Signed)
04/27/2023 1424  Name: [name] MRN: [id number] DOB: 1980/12/21  Reason for Call:  Transition of Care Hospital Discharge Call  Contact Status: Patient Contact Status: Message  Language assistant needed: Interpreter Mode: Interpreter Not Needed        Follow-Up Questions:    Inocente Salles Postnatal Depression Scale:  In the Past 7 Days:    PHQ2-9 Depression Scale:     Discharge Follow-up:    Post-discharge interventions: NA  Signature  Signe Colt

## 2023-08-06 ENCOUNTER — Telehealth: Payer: Self-pay | Admitting: Family Medicine

## 2023-08-06 NOTE — Telephone Encounter (Signed)
Patient dropped off document  Physician wellness screening results form , to be filled out by provider. Patient requested to send it back via Call Patient to pick up within 5-days. Document is located in providers tray at front office.Please advise at Mobile [phone] (mobile)
